# Patient Record
Sex: Male | Born: 1960 | Race: White | Hispanic: Yes | Smoking: Never smoker
Health system: Southern US, Community
[De-identification: ages and names within clinical notes are randomized; demographics above are authoritative.]

---

## 2015-02-25 ENCOUNTER — Encounter: Payer: Self-pay | Admitting: Emergency Medicine

## 2015-02-25 ENCOUNTER — Ambulatory Visit: Payer: Worker's Compensation

## 2015-02-25 ENCOUNTER — Ambulatory Visit
Admission: EM | Admit: 2015-02-25 | Discharge: 2015-02-25 | Disposition: A | Discharge status: Home / Self Care | Payer: Worker's Compensation | Attending: Family Medicine | Admitting: Family Medicine

## 2015-02-25 DIAGNOSIS — M5432 Sciatica, left side: Secondary | ICD-10-CM

## 2015-02-25 DIAGNOSIS — S338XXA Sprain of other parts of lumbar spine and pelvis, initial encounter: Secondary | ICD-10-CM | POA: Diagnosis not present

## 2015-02-25 DIAGNOSIS — S39012A Strain of muscle, fascia and tendon of lower back, initial encounter: Secondary | ICD-10-CM

## 2015-02-25 MED ORDER — CYCLOBENZAPRINE HCL 10 MG PO TABS: 10.0000 mg | ORAL_TABLET | Freq: Three times a day (TID) | ORAL | Status: DC | PRN

## 2015-02-25 MED ORDER — PREDNISONE 10 MG PO TABS: ORAL_TABLET | ORAL | Status: DC

## 2015-02-25 MED ORDER — KETOROLAC TROMETHAMINE 60 MG/2ML IM SOLN
60.0000 mg | Freq: Once | INTRAMUSCULAR | Status: AC
  Administered 2015-02-25: 60 mg via INTRAMUSCULAR

## 2015-02-25 NOTE — ED Notes (Signed)
Patient c/o lower back pain since last week.  Patient [place]

## 2015-02-25 NOTE — ED Provider Notes (Signed)
[facility] Emergency Department Provider Note  ____________________________________________  Time seen: Approximately 10:00 AM  I have reviewed the triage vital signs and the nursing notes.   HISTORY  Chief Complaint Back Pain    HPI Andre Yang is a 54 y.o. malepresents for the complaint of left lower back pain. Patient reports [place] Patient states while he was there he was having to move a lot of sand bags. Patient states that as the day progressed he started to feel some low back pain. Patient states that that he was doing a lot of bending and twisting. Denies fall or direct trauma. States he had some soreness while he was moving the sand bags. States the soreness continued but the next day he woke up with increased pain. Patient states that he has had continued pain since. Reports over-the-counter ibuprofen did initially help with the pain. States if he is sitting still and not moving he is an 0 out of 10 pain. However reports with bending, twisting and walking he'll have intermittent sharp pain. Patient states that sharp pain at highest is 6 out of 10 and states that occasionally pain will radiate down left leg. Describes left leg radiation as a sharp tingling pain.States pain also present with walking. Denies numbness or loss of sensation.   Denies other back pain or neck pain. Denies urinary or bowel retention or incontinence. Denies numbness or tingling sensation. Patient again reports that when he is sitting still pain is 0/10. States over-the-counter ibuprofen and Aleve has helped some but reports over the last few days ibuprofen or Aleve has not helped.    History reviewed. No pertinent past medical history.  There are no active problems to display for this patient.   History reviewed. No pertinent past surgical history.  No current outpatient prescriptions on file.  Allergies Review of patient's  allergies indicates no known allergies.  History reviewed. No pertinent family history.  Social History Social History  Substance Use Topics  . Smoking status: Never Smoker   . Smokeless tobacco: Never Used  . Alcohol Use: Yes    Review of Systems Constitutional: No fever/chills Eyes: No visual changes. ENT: No sore throat. Cardiovascular: Denies chest pain. Respiratory: Denies shortness of breath. Gastrointestinal: No abdominal pain.  No nausea, no vomiting.  No diarrhea.  No constipation. Genitourinary: Negative for dysuria. Musculoskeletal: positive for back pain. Skin: Negative for rash. Neurological: Negative for headaches, focal weakness or numbness.  10-point ROS otherwise negative.  ____________________________________________   PHYSICAL EXAM:  VITAL SIGNS: ED Triage Vitals  Enc Vitals Group     BP 02/25/15 0919 142/90 mmHg     Pulse Rate 02/25/15 0919 90     Resp 02/25/15 0919 16     Temp 02/25/15 0919 97.5 F (36.4 C)     Temp Source 02/25/15 0919 Tympanic     SpO2 02/25/15 0919 98 %     Weight 02/25/15 0919 250 lb (113.399 kg)     Height 02/25/15 0919  (1.753 m)     Head Cir --      Peak Flow --      Pain Score 02/25/15 0921 8     Pain Loc --      Pain Edu? --      Excl. in GC? --    Today's Vitals   02/25/15 0919 02/25/15 0921 02/25/15 1103  BP: 142/90  133/91  Pulse: 90  91  Temp: 97.5 F (36.4 C)    TempSrc: Tympanic    Resp: 16    Height: 5\' 9"  (1.753 m)    Weight: 250 lb (113.399 kg)    SpO2: 98%    PainSc:  8  5     Constitutional: Alert and oriented. Well appearing and in no acute distress. Eyes: Conjunctivae are normal. PERRL. EOMI. Head: Atraumatic.  Nose: No congestion/rhinnorhea.  Mouth/Throat: Mucous membranes are moist.  Oropharynx non-erythematous. Neck: No stridor.  No cervical spine tenderness to palpation. Hematological/Lymphatic/Immunilogical: No cervical lymphadenopathy. Cardiovascular: Normal rate, regular  rhythm. Grossly normal heart sounds.  Good peripheral circulation. Respiratory: Normal respiratory effort.  No retractions. Lungs CTAB. Gastrointestinal: Soft and nontender. No distention. Normal Bowel sounds. No CVA tenderness. Musculoskeletal: No lower or upper extremity tenderness nor edema.  No joint effusions. Bilateral pedal pulses equal and easily palpated.  No cervical or thoracic tenderness to palpation. No CVA tenderness. Mild mid lower lumbar tenderness to palpation, mild to moderate left paralumbar to palpation and tenderness over left sciatic notch. Patient changes position from lying to standing quickly without distress. Right straight leg test negative. Mild pain with left straight leg test at approximately 45 degrees. No saddle anesthesia. Good strength with plantar flexion and dorsiflexion Bilaterally. No ataxia. Mild antalgic gait. No ecchymosis or swelling.  Neurologic:  Normal speech and language. No gross focal neurologic deficits are appreciated. No gait instability. Skin:  Skin is warm, dry and intact. No rash noted. Psychiatric: Mood and affect are normal. Speech and behavior are normal.   ____________________________________________   LABS (all labs ordered are listed, but only abnormal results are displayed)  Labs Reviewed - No data to display  RADIOLOGY   EXAM: LUMBAR SPINE - COMPLETE 4+ VIEW  COMPARISON: None.  FINDINGS: Normal alignment of the lumbar vertebral bodies. The disc spaces are maintained. Mild degenerative changes with marginal osteophytes. Moderate facet disease in the lower lumbar spine without definite pars defects. The visualized bony pelvis is intact. Mild SI joint degenerative changes.  IMPRESSION: Degenerative changes but no acute findings.   Electronically Signed By: Rudie MeyerP. Gallerani M.D. On: 02/25/2015 10:38  I, Renford DillsLindsey Taavi Hoose, personally viewed and evaluated these images (plain radiographs) as part of my medical decision  making.     INITIAL IMPRESSION / ASSESSMENT AND PLAN / ED COURSE  Pertinent labs & imaging results that were available during my care of the patient were reviewed by me and considered in my medical decision making (see chart for details).  Very well-appearing patient. No acute distress. Presents for complaints of left lower back pain. Reports onset was while [place] multiple heavy [place] 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 Patient reports that he did have pain during activity but pain increased the next day after lying still in bed and then waking up. Patient does report the pain and the pain radiation down left leg primarily associated with movement. Denies other pain.  Denies urinary or bowel changes or retention. Denies direct trauma or fall. IM Toradol 60 mg.    Ambulatory with steady gait. Changes position from lying to standing quickly without distress. Lumbar x-ray with degenerative changes but no acute findings. Patient reports that after IM Toradol pain is much improved and sharp pain that he was receiving with position changes has decreased. Discussed supportive treatments. Lumbar xray degenerative changes but no acute per radiology. 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 Work note given for today.   Discussed with patient that if pain continues or worsens he'll need to follow-up with orthopedic or physician of his employers choice for follow up. As over the counter ibuprofen no longer helping patient's pain will treat with prednisone taper and prn flexeril. Discussed stretching, ice and rest. Discussed follow up with Primary care physician this week.  Discussed follow up and return parameters including no resolution or any worsening concerns. Patient verbalized understanding and agreed to plan.   ____________________________________________   FINAL CLINICAL IMPRESSION(S) / ED DIAGNOSES  Final diagnoses:  Lumbosacral strain, initial encounter  Sciatica of left side       Renford Dills, NP 02/25/15 1526

## 2015-02-25 NOTE — Discharge Instructions (Signed)
Take medication as prescribed. Apply ice. Rest. Stretch as discussed.   Follow up with your primary care physician as needed. Follow up with orthopedic or workers compensation physician or your employees choice as needed for continued pain.   Return to Urgent care for new or worsening concerns.   Back Exercises The following exercises strengthen the muscles that help to support the back. They also help to keep the lower back flexible. Doing these exercises can help to prevent back pain or lessen existing pain. If you have back pain or discomfort, try doing these exercises 2-3 times each day or as told by your health care provider. When the pain goes away, do them once each day, but increase the number of times that you repeat the steps for each exercise (do more repetitions). If you do not have back pain or discomfort, do these exercises once each day or as told by your health care provider. EXERCISES Single Knee to Chest Repeat these steps 3-5 times for each leg:  Lie on your back on a firm bed or the floor with your legs extended.  Bring one knee to your chest. Your other leg should stay extended and in contact with the floor.  Hold your knee in place by grabbing your knee or thigh.  Pull on your knee until you feel a gentle stretch in your lower back.  Hold the stretch for 10-30 seconds.  Slowly release and straighten your leg. Pelvic Tilt Repeat these steps 5-10 times:  Lie on your back on a firm bed or the floor with your legs extended.  Bend your knees so they are pointing toward the ceiling and your feet are flat on the floor.  Tighten your lower abdominal muscles to press your lower back against the floor. This motion will tilt your pelvis so your tailbone points up toward the ceiling instead of pointing to your feet or the floor.  With gentle tension and even breathing, hold this position for 5-10 seconds. Cat-Cow Repeat these steps until your lower back becomes more  flexible:  Get into a hands-and-knees position on a firm surface. Keep your hands under your shoulders, and keep your knees under your hips. You may place padding under your knees for comfort.  Let your head hang down, and point your tailbone toward the floor so your lower back becomes rounded like the back of a cat.  Hold this position for 5 seconds.  Slowly lift your head and point your tailbone up toward the ceiling so your back forms a sagging arch like the back of a cow.  Hold this position for 5 seconds. Press-Ups Repeat these steps 5-10 times:  Lie on your abdomen (face-down) on the floor.  Place your palms near your head, about shoulder-width apart.  While you keep your back as relaxed as possible and keep your hips on the floor, slowly straighten your arms to raise the top half of your body and lift your shoulders. Do not use your back muscles to raise your upper torso. You may adjust the placement of your hands to make yourself more comfortable.  Hold this position for 5 seconds while you keep your back relaxed.  Slowly return to lying flat on the floor. Bridges Repeat these steps 10 times:  Lie on your back on a firm surface.  Bend your knees so they are pointing toward the ceiling and your feet are flat on the floor.  Tighten your buttocks muscles and lift your buttocks off of the  floor until your waist is at almost the same height as your knees. You should feel the muscles working in your buttocks and the back of your thighs. If you do not feel these muscles, slide your feet 1-2 inches farther away from your buttocks.  Hold this position for 3-5 seconds.  Slowly lower your hips to the starting position, and allow your buttocks muscles to relax completely. If this exercise is too easy, try doing it with your arms crossed over your chest. Abdominal Crunches Repeat these steps 5-10 times:  Lie on your back on a firm bed or the floor with your legs extended.  Bend  your knees so they are pointing toward the ceiling and your feet are flat on the floor.  Cross your arms over your chest.  Tip your chin slightly toward your chest without bending your neck.  Tighten your abdominal muscles and slowly raise your trunk (torso) high enough to lift your shoulder blades a tiny bit off of the floor. Avoid raising your torso higher than that, because it can put too much stress on your low back and it does not help to strengthen your abdominal muscles.  Slowly return to your starting position. Back Lifts Repeat these steps 5-10 times:  Lie on your abdomen (face-down) with your arms at your sides, and rest your forehead on the floor.  Tighten the muscles in your legs and your buttocks.  Slowly lift your chest off of the floor while you keep your hips pressed to the floor. Keep the back of your head in line with the curve in your back. Your eyes should be looking at the floor.  Hold this position for 3-5 seconds.  Slowly return to your starting position. SEEK MEDICAL CARE IF:  Your back pain or discomfort gets much worse when you do an exercise.  Your back pain or discomfort does not lessen within 2 hours after you exercise. If you have any of these problems, stop doing these exercises right away. Do not do them again unless your health care provider says that you can. SEEK IMMEDIATE MEDICAL CARE IF:  You develop sudden, severe back pain. If this happens, stop doing the exercises right away. Do not do them again unless your health care provider says that you can.   This information is not intended to replace advice given to you by your health care provider. Make sure you discuss any questions you have with your health care provider.   Document Released: 06/04/2004 Document Revised: 01/16/2015 Document Reviewed: 06/21/2014 Elsevier Interactive Patient Education 2016 Else[employer] Inc.  Sciatica Sciatica is pain, weakness, numbness, or tingling along the path of  the sciatic nerve. The nerve starts in the lower back and runs down the back of each leg. The nerve controls the muscles in the lower leg and in the back of the knee, while also providing sensation to the back of the thigh, lower leg, and the sole of your foot. Sciatica is a symptom of another medical condition. For instance, nerve damage or certain conditions, such as a herniated disk or bone spur on the spine, pinch or put pressure on the sciatic nerve. This causes the pain, weakness, or other sensations normally associated with sciatica. Generally, sciatica only affects one side of the body. CAUSES   Herniated or slipped disc.  Degenerative disk disease.  A pain disorder involving the narrow muscle in the buttocks (piriformis syndrome).  Pelvic injury or fracture.  Pregnancy.  Tumor (rare). SYMPTOMS  Symptoms can  vary from mild to very severe. The symptoms usually travel from the low back to the buttocks and down the back of the leg. Symptoms can include:  Mild tingling or dull aches in the lower back, leg, or hip.  Numbness in the back of the calf or sole of the foot.  Burning sensations in the lower back, leg, or hip.  Sharp pains in the lower back, leg, or hip.  Leg weakness.  Severe back pain inhibiting movement. These symptoms may get worse with coughing, sneezing, laughing, or prolonged sitting or standing. Also, being overweight may worsen symptoms. DIAGNOSIS  Your caregiver will perform a physical exam to look for common symptoms of sciatica. He or she may ask you to do certain movements or activities that would trigger sciatic nerve pain. Other tests may be performed to find the cause of the sciatica. These may include:  Blood tests.  X-rays.  Imaging tests, such as an MRI or CT scan. TREATMENT  Treatment is directed at the cause of the sciatic pain. Sometimes, treatment is not necessary and the pain and discomfort goes away on its own. If treatment is needed, your  caregiver may suggest:  Over-the-counter medicines to relieve pain.  Prescription medicines, such as anti-inflammatory medicine, muscle relaxants, or narcotics.  Applying heat or ice to the painful area.  Steroid injections to lessen pain, irritation, and inflammation around the nerve.  Reducing activity during periods of pain.  Exercising and stretching to strengthen your abdomen and improve flexibility of your spine. Your caregiver may suggest losing weight if the extra weight makes the back pain worse.  Physical therapy.  Surgery to eliminate what is pressing or pinching the nerve, such as a bone spur or part of a herniated disk. HOME CARE INSTRUCTIONS   Only take over-the-counter or prescription medicines for pain or discomfort as directed by your caregiver.  Apply ice to the affected area for 20 minutes, 3-4 times a day for the first 48-72 hours. Then try heat in the same way.  Exercise, stretch, or perform your usual activities if these do not aggravate your pain.  Attend physical therapy sessions as directed by your caregiver.  Keep all follow-up appointments as directed by your caregiver.  Do not wear high heels or shoes that do not provide proper support.  Check your mattress to see if it is too soft. A firm mattress may lessen your pain and discomfort. SEEK IMMEDIATE MEDICAL CARE IF:   You lose control of your bowel or bladder (incontinence).  You have increasing weakness in the lower back, pelvis, buttocks, or legs.  You have redness or swelling of your back.  You have a burning sensation when you urinate.  You have pain that gets worse when you lie down or awakens you at night.  Your pain is worse than you have experienced in the past.  Your pain is lasting longer than 4 weeks.  You are suddenly losing weight without reason. MAKE SURE YOU:  Understand these instructions.  Will watch your condition.  Will get help right away if you are not doing well  or get worse.   This information is not intended to replace advice given to you by your health care provider. Make sure you discuss any questions you have with your health care provider.   Document Released: 04/21/2001 Document Revised: 01/16/2015 Document Reviewed: 09/06/2011 Elsevier Interactive Patient Education [employer]

## 2015-07-12 ENCOUNTER — Ambulatory Visit
Admission: EM | Admit: 2015-07-12 | Discharge: 2015-07-12 | Disposition: A | Discharge status: Home / Self Care | Payer: BLUE CROSS/BLUE SHIELD | Attending: Family Medicine | Admitting: Family Medicine

## 2015-07-12 DIAGNOSIS — B349 Viral infection, unspecified: Secondary | ICD-10-CM | POA: Diagnosis not present

## 2015-07-12 LAB — RAPID INFLUENZA A&B ANTIGENS: Influenza B (ARMC): NEGATIVE

## 2015-07-12 LAB — RAPID STREP SCREEN (MED CTR MEBANE ONLY): Streptococcus, Group A Screen (Direct): NEGATIVE

## 2015-07-12 LAB — RAPID INFLUENZA A&B ANTIGENS (ARMC ONLY): INFLUENZA A (ARMC): NEGATIVE

## 2015-07-12 MED ORDER — OSELTAMIVIR PHOSPHATE 75 MG PO CAPS: 75.0000 mg | ORAL_CAPSULE | Freq: Two times a day (BID) | ORAL | Status: DC

## 2015-07-12 MED ORDER — ALBUTEROL SULFATE HFA 108 (90 BASE) MCG/ACT IN AERS: 1.0000 | INHALATION_SPRAY | Freq: Four times a day (QID) | RESPIRATORY_TRACT | Status: DC | PRN

## 2015-07-12 MED ORDER — HYDROCOD POLST-CPM POLST ER 10-8 MG/5ML PO SUER: 5.0000 mL | Freq: Two times a day (BID) | ORAL | Status: DC | PRN

## 2015-07-12 NOTE — ED Notes (Addendum)
Patient c/o cough, chest pain from coughing, sore throat, fever (101 degrees yesterday), and body aches which all started this past Monday.

## 2015-07-12 NOTE — ED Provider Notes (Signed)
CSN: [id number]     Arrival date & time 07/12/15  4098 History   None    Chief Complaint  Patient presents with  . Cough  . Sore Throat   (Consider location/radiation/quality/duration/timing/severity/associated sxs/prior Treatment) Patient is a 55 y.o. male presenting with cough, pharyngitis, and URI. The history is provided by the patient.  Cough Associated symptoms: fever, headaches, myalgias and wheezing   Sore Throat Associated symptoms include headaches.  URI Presenting symptoms: congestion, cough, fatigue and fever   Severity:  Moderate Onset quality:  Sudden Duration:  2 days Timing:  Constant Progression:  Worsening Chronicity:  New Relieved by:  Nothing Ineffective treatments:  OTC medications Associated symptoms: arthralgias, headaches, myalgias and wheezing   Associated symptoms: no sinus pain   Risk factors: sick contacts   Risk factors: not elderly, no chronic cardiac disease, no chronic kidney disease, no chronic respiratory disease, no diabetes mellitus, no immunosuppression, no recent illness and no recent travel     History reviewed. No pertinent past medical history. History reviewed. No pertinent past surgical history. History reviewed. No pertinent family history. Social History  Substance Use Topics  . Smoking status: Never Smoker   . Smokeless tobacco: Never Used  . Alcohol Use: Yes    Review of Systems  Constitutional: Positive for fever and fatigue.  HENT: Positive for congestion.   Respiratory: Positive for cough and wheezing.   Musculoskeletal: Positive for myalgias and arthralgias.  Neurological: Positive for headaches.    Allergies  Review of patient's allergies indicates no known allergies.  Home Medications   Prior to Admission medications   Medication Sig Start Date End Date Taking? Authorizing Provider  albuterol (PROVENTIL HFA;VENTOLIN HFA) 108 (90 Base) MCG/ACT inhaler Inhale 1-2 puffs into the lungs every 6 (six) hours as needed  for wheezing or shortness of breath. 07/12/15   Payton Mccallum, MD  chlorpheniramine-HYDROcodone (TUSSIONEX PENNKINETIC ER) 10-8 MG/5ML SUER Take 5 mLs by mouth every 12 (twelve) hours as needed for cough. 07/12/15   Payton Mccallum, MD  cyclobenzaprine (FLEXERIL) 10 MG tablet Take 1 tablet (10 mg total) by mouth every 8 (eight) hours as needed for muscle spasms (PRN pain. Do not drive or operate heavy machinery while taking as can cause drowsiness.). 02/25/15   Renford Dills, NP  oseltamivir (TAMIFLU) 75 MG capsule Take 1 capsule (75 mg total) by mouth 2 (two) times daily. 07/12/15   Payton Mccallum, MD  predniSONE (DELTASONE) 10 MG tablet Start 60 mg po day one, then 50 mg po day two, taper by 10 mg daily until complete. 02/25/15   Renford Dills, NP   Meds Ordered and Administered this Visit  Medications - No data to display  BP 137/90 mmHg  Pulse 108  Temp(Src) 100.9 F (38.3 C) (Oral)  Resp 20  Ht  (1.753 m)  Wt 270 lb (122.471 kg)  BMI 39.85 kg/m2  SpO2 96% No data found.   Physical Exam  Constitutional: He appears well-developed and well-nourished. No distress.  HENT:  Head: Normocephalic and atraumatic.  Right Ear: Tympanic membrane, external ear and ear canal normal.  Left Ear: Tympanic membrane, external ear and ear canal normal.  Nose: Rhinorrhea present.  Mouth/Throat: Uvula is midline, oropharynx is clear and moist and mucous membranes are normal. No oropharyngeal exudate or tonsillar abscesses.  Eyes: Conjunctivae and EOM are normal. Pupils are equal, round, and reactive to light. Right eye exhibits no discharge. Left eye exhibits no discharge. No scleral icterus.  Neck: Normal range of  motion. Neck supple. No tracheal deviation present. No thyromegaly present.  Cardiovascular: Normal rate, regular rhythm and normal heart sounds.   Pulmonary/Chest: Effort normal and breath sounds normal. No stridor. No respiratory distress. He has no wheezes. He has no rales. He exhibits no  tenderness.  Lymphadenopathy:    He has no cervical adenopathy.  Neurological: He is alert.  Skin: Skin is warm and dry. No rash noted. He is not diaphoretic.  Nursing note and vitals reviewed.   ED Course  Procedures (including critical care time)  Labs Review Labs Reviewed  RAPID INFLUENZA A&B ANTIGENS ([facility] ONLY)  RAPID STREP SCREEN (NOT AT [facility])  CULTURE, GROUP A STREP [facility])    Imaging Review No results found.   Visual Acuity Review  Right Eye Distance:   Left Eye Distance:   Bilateral Distance:    Right Eye Near:   Left Eye Near:    Bilateral Near:         MDM   1. Viral syndrome    New Prescriptions   ALBUTEROL (PROVENTIL HFA;VENTOLIN HFA) 108 (90 BASE) MCG/ACT INHALER    Inhale 1-2 puffs into the lungs every 6 (six) hours as needed for wheezing or shortness of breath.   CHLORPHENIRAMINE-HYDROCODONE (TUSSIONEX PENNKINETIC ER) 10-8 MG/5ML SUER    Take 5 mLs by mouth every 12 (twelve) hours as needed for cough.   OSELTAMIVIR (TAMIFLU) 75 MG CAPSULE    Take 1 capsule (75 mg total) by mouth 2 (two) times daily.  1.  diagnosis reviewed with patient 2. rx as per orders above; reviewed possible side effects, interactions, risks and benefits  3. Recommend supportive treatment with rest, increased fluids, otc analgesics 4. Follow-up prn if symptoms worsen or don't improve    Payton Mccallumrlando Deshanda Molitor, MD 07/12/15 [phone]

## 2015-07-14 ENCOUNTER — Telehealth (HOSPITAL_COMMUNITY): Payer: Self-pay | Admitting: Internal Medicine

## 2015-07-14 DIAGNOSIS — J020 Streptococcal pharyngitis: Secondary | ICD-10-CM

## 2015-07-14 LAB — CULTURE, GROUP A STREP (THRC)

## 2015-07-14 MED ORDER — PENICILLIN V POTASSIUM 500 MG PO TABS: 500.0000 mg | ORAL_TABLET | Freq: Two times a day (BID) | ORAL | Status: DC

## 2015-07-14 NOTE — ED Notes (Signed)
Throat culture growing group B strep; rx penicillin V sent to pharmacy of record ([facility] in [place]). Clinical staff, please let patient know.  Recheck for persistent symptoms.  LM  Eustace MooreLaura W Sheran Newstrom, MD 07/14/15 [phone]

## 2015-07-15 ENCOUNTER — Telehealth: Payer: Self-pay

## 2015-07-15 NOTE — ED Notes (Signed)
Patient called [facility] just now stating no prescription had been sent to [facility] in [place]. This nurse contacted [facility] [place] and Rx for Veetids. Take 1 po bid x 10 days. #20. No refills. This is the Rx that Dr. Pollie FriarL. Murray had entered into the patient records. Patient informed and will pick up

## 2016-05-11 HISTORY — PX: EYE SURGERY: SHX253

## 2016-05-15 ENCOUNTER — Encounter: Payer: Self-pay | Admitting: Emergency Medicine

## 2016-05-15 ENCOUNTER — Ambulatory Visit
Admission: EM | Admit: 2016-05-15 | Discharge: 2016-05-15 | Disposition: A | Discharge status: Home / Self Care | Payer: BLUE CROSS/BLUE SHIELD | Attending: Family Medicine | Admitting: Family Medicine

## 2016-05-15 DIAGNOSIS — H1031 Unspecified acute conjunctivitis, right eye: Secondary | ICD-10-CM

## 2016-05-15 MED ORDER — POLYMYXIN B-TRIMETHOPRIM 10000-0.1 UNIT/ML-% OP SOLN: 2.0000 [drp] | OPHTHALMIC | 0 refills | Status: AC

## 2016-05-15 NOTE — Discharge Instructions (Signed)
Use medication as prescribed. Rest. Drink plenty of fluids. Good hand hygiene.  Follow up with ophthalmology as discussed.   Follow up with your primary care physician this week as needed. Return to Urgent care for new or worsening concerns.

## 2016-05-15 NOTE — ED Triage Notes (Signed)
Patient c/o redness and drainage form his right eye for a month.

## 2016-05-15 NOTE — ED Provider Notes (Signed)
[facility] ____________________________________________  Time seen: Approximately 12:51 PM  I have reviewed the triage vital signs and the nursing notes.   HISTORY  Chief Complaint Eye Problem   HPI Andre JanusJerry Yang is a 56 y.o. male presents for evaluation of right eye redness and yellow drainage. Patient reports he has been having some intermittent watery eye and drainage for the last 3-4 weeks, in which she felt was consistent with his normal hayfever. Patient reports over the last week he has had more frequent yellowish drainage from right eye and states that the last 2 days has had consistent yellowish drainage in the from the right eye. Reports right eye redness. Reports some intermittent right eye irritation, but denies pain. Denies any foreign body sensation. Denies any abrupt onset. Denies any trauma, injury or foreign body. Denies pinkeye exposure. Denies chemical exposure. Denies welding. Reports intermittently has a mild blurriness to right eye with drainage present, denies other vision changes.  Denies any other complaints symptoms. Denies eye pain. Denies cough, congestion, fever, chest pain, shortness breath or other complaints.  History reviewed. No pertinent past medical history.  There are no active problems to display for this patient.   History reviewed. No pertinent surgical history.    No current facility-administered medications for this encounter.   Current Outpatient Prescriptions:  .  trimethoprim-polymyxin b (POLYTRIM) ophthalmic solution, Place 2 drops into the right eye every 4 (four) hours. For 7 days, Disp: 10 mL, Rfl: 0  Allergies Patient has no known allergies.  History reviewed. No pertinent family history.  Social History Social History  Substance Use Topics  . Smoking status: Never Smoker  . Smokeless tobacco: Never Used  . Alcohol use Yes    Review of Systems Constitutional: No fever/chills Eyes: .As above. ENT: No sore  throat. Cardiovascular: Denies chest pain. Respiratory: Denies shortness of breath. Gastrointestinal: No abdominal pain.  No nausea, no vomiting.  No diarrhea.  No constipation. Genitourinary: Negative for dysuria. Musculoskeletal: Negative for back pain. Skin: Negative for rash. Neurological: Negative for headaches, focal weakness or numbness.  10-point ROS otherwise negative.  ____________________________________________   PHYSICAL EXAM:  VITAL SIGNS: ED Triage Vitals  Enc Vitals Group     BP 05/15/16 1131 (!) 147/90     Pulse Rate 05/15/16 1131 96     Resp 05/15/16 1131 18     Temp 05/15/16 1131 98.5 F (36.9 C)     Temp Source 05/15/16 1131 Oral     SpO2 05/15/16 1131 96 %     Weight 05/15/16 1128 240 lb (108.9 kg)     Height 05/15/16 1128 5\' 9"  (1.753 m)     Head Circumference --      Peak Flow --      Pain Score 05/15/16 1130 6     Pain Loc --      Pain Edu? --      Excl. in GC? --      Visual Acuity  Right Eye Distance: 20/100 uncorrected rechecked 20/50 Left Eye Distance: 20/50 uncorrected rechecked 20/40 Bilateral Distance:    Constitutional: Alert and oriented. Well appearing and in no acute distress. Eyes:  Minimal left conjunctival injection. Mild right conjunctival injection. Active yellowish drainage present to right eye with surrounding yellowish crusting. No left eye drainage. No foreign bodies visualized bilaterally.PERRL. EOMI. No pain with EOMs. Bilateral eyes nontender. No surrounding tenderness, swelling or erythema bilaterally. ENT      Head: Normocephalic and atraumatic.  Nose: No congestion/rhinnorhea.      Mouth/Throat: Mucous membranes are moist.Oropharynx non-erythematous. No tonsillar swelling or exudate.  Neck: No stridor. Supple without meningismus.  Hematological/Lymphatic/Immunilogical: No cervical lymphadenopathy. Cardiovascular: Normal rate, regular rhythm. Grossly normal heart sounds.  Good peripheral circulation. Respiratory:  Normal respiratory effort without tachypnea nor retractions. Breath sounds are clear and equal bilaterally. No wheezes/rales/rhonchi.. Gastrointestinal: Soft and nontender. No distention. Normal Bowel sounds. No CVA tenderness. Musculoskeletal:  Ambulatory with steady gait. Neurologic:  Normal speech and language. Speech is normal. No gait instability.  Skin:  Skin is warm, dry and intact. No rash noted. Psychiatric: Mood and affect are normal. Speech and behavior are normal. Patient exhibits appropriate insight and judgment   ___________________________________________   LABS (all labs ordered are listed, but only abnormal results are displayed)  Labs Reviewed - No data to display  PROCEDURES Procedures    INITIAL IMPRESSION / ASSESSMENT AND PLAN / ED COURSE  Pertinent labs & imaging results that were available during my care of the patient were reviewed by me and considered in my medical decision making (see chart for details).  Well-appearing patient. No acute distress. Suspect patient had initial viral or allergic conjunctivitis progressing to bacterial conjunctivitis. Denies foreign body sensation or trauma. Will treat patient with Polytrim ophthalmic drops to right eye. Encourage ophthalmology follow-up. Discussed supportive care and good hand hygiene. Discussed strict follow-up and return parameters.Discussed indication, risks and benefits of medications with patient.  Discussed follow up with Primary care physician this week. Discussed follow up and return parameters including no resolution or any worsening concerns. Patient verbalized understanding and agreed to plan.   ____________________________________________   FINAL CLINICAL IMPRESSION(S) / ED DIAGNOSES  Final diagnoses:  Acute bacterial conjunctivitis of right eye     Discharge Medication List as of 05/15/2016 12:59 PM    START taking these medications   Details  trimethoprim-polymyxin b (POLYTRIM) ophthalmic  solution Place 2 drops into the right eye every 4 (four) hours. For 7 days, Starting Fri 05/15/2016, Until Fri 05/22/2016, Normal        Note: This dictation was prepared with Dragon dictation along with smaller phrase technology. Any transcriptional errors that result from this process are unintentional.    Clinical Course       Renford Dills, NP 05/15/16 1311

## 2016-08-14 ENCOUNTER — Other Ambulatory Visit
Admission: RE | Admit: 2016-08-14 | Discharge: 2016-08-14 | Disposition: A | Discharge status: Home / Self Care | Payer: BLUE CROSS/BLUE SHIELD | Source: Ambulatory Visit | Attending: Ophthalmology | Admitting: Ophthalmology

## 2016-08-14 ENCOUNTER — Encounter: Payer: Self-pay | Admitting: Emergency Medicine

## 2016-08-14 ENCOUNTER — Emergency Department
Admission: EM | Admit: 2016-08-14 | Discharge: 2016-08-14 | Disposition: A | Discharge status: Home / Self Care | Payer: BLUE CROSS/BLUE SHIELD | Attending: Emergency Medicine | Admitting: Emergency Medicine

## 2016-08-14 DIAGNOSIS — H1089 Other conjunctivitis: Secondary | ICD-10-CM | POA: Insufficient documentation

## 2016-08-14 DIAGNOSIS — H109 Unspecified conjunctivitis: Secondary | ICD-10-CM

## 2016-08-14 DIAGNOSIS — H1031 Unspecified acute conjunctivitis, right eye: Secondary | ICD-10-CM

## 2016-08-14 DIAGNOSIS — H578 Other specified disorders of eye and adnexa: Secondary | ICD-10-CM | POA: Diagnosis present

## 2016-08-14 MED ORDER — MOXIFLOXACIN HCL 0.5 % OP SOLN: 1.0000 [drp] | Freq: Three times a day (TID) | OPHTHALMIC | 0 refills | Status: DC

## 2016-08-14 MED ORDER — MOXIFLOXACIN HCL 0.5 % OP SOLN
1.0000 [drp] | Freq: Once | OPHTHALMIC | Status: AC
  Administered 2016-08-14: 1 [drp] via OPHTHALMIC
  Filled 2016-08-14: qty 3

## 2016-08-14 MED ORDER — FLUORESCEIN SODIUM 0.6 MG OP STRP
2.0000 | ORAL_STRIP | Freq: Once | OPHTHALMIC | Status: AC
  Administered 2016-08-14: 2 via OPHTHALMIC
  Filled 2016-08-14: qty 2

## 2016-08-14 MED ORDER — TETRACAINE HCL 0.5 % OP SOLN
2.0000 [drp] | Freq: Once | OPHTHALMIC | Status: AC
  Administered 2016-08-14: 2 [drp] via OPHTHALMIC
  Filled 2016-08-14: qty 2

## 2016-08-14 NOTE — ED Triage Notes (Addendum)
Patient ambulatory to triage with steady gait, without difficulty or distress noted; pt reports seen at urgent care month ago and rx drops for right eye ?infection, never improved; this morning having difficulty seeing out of such; swelling noted to lower sclera with redness to conjunctivae

## 2016-08-14 NOTE — ED Provider Notes (Signed)
[facility] Emergency Department Provider Note  ____________________________________________   First MD Initiated Contact with Patient 08/14/16 [phone]     (approximate)  I have reviewed the triage vital signs and the nursing notes.   HISTORY  Chief Complaint Eye Problem    HPI Andre Yang is a 56 y.o. male who presents to the emergency department with 1 day of swelling to his upper and lower eyelids. For the last 2 weeks or so he has woken up every morning with thick crusting discharge and he can't open his right eye until he gets in the shower and washes. About a month or 2 ago he had a similar episode that was not so severe and he went to an urgent care and was given an unknown eyedrop which initially helped but the symptoms recurred 2 weeks ago. He does not wear glasses and does not work contact lenses.   History reviewed. No pertinent past medical history.  There are no active problems to display for this patient.   History reviewed. No pertinent surgical history.  Prior to Admission medications   Medication Sig Start Date End Date Taking? Authorizing Provider  moxifloxacin (VIGAMOX) 0.5 % ophthalmic solution Place 1 drop into the right eye 3 (three) times daily. 08/14/16   Merrily Brittle, MD    Allergies Patient has no known allergies.  No family history on file.  Social History Social History  Substance Use Topics  . Smoking status: Never Smoker  . Smokeless tobacco: Never Used  . Alcohol use Yes    Review of Systems Constitutional: No fever/chills Eyes: Positive visual changes. ENT: No sore throat. Cardiovascular: Denies chest pain. Respiratory: Denies shortness of breath. Gastrointestinal: No abdominal pain.  No nausea, no vomiting.  No diarrhea.  No constipation. Genitourinary: Negative for dysuria. Musculoskeletal: Negative for back pain. Skin: Negative for rash. Neurological: Negative for headaches, focal weakness or  numbness.  10-point ROS otherwise negative.  ____________________________________________   PHYSICAL EXAM:  VITAL SIGNS: ED Triage Vitals  Enc Vitals Group     BP 08/14/16 0602 (!) 147/82     Pulse Rate 08/14/16 0602 97     Resp 08/14/16 0602 18     Temp 08/14/16 0602 98 F (36.7 C)     Temp Source 08/14/16 0602 Oral     SpO2 08/14/16 0602 95 %     Weight 08/14/16 0600 270 lb (122.5 kg)     Height 08/14/16 0600  (1.753 m)     Head Circumference --      Peak Flow --      Pain Score 08/14/16 0600 1     Pain Loc --      Pain Edu? --      Excl. in GC? --     Constitutional: Alert and oriented x 4 well appearing nontoxic no diaphoresis speaks in full, clear sentences Eyes: PERRL EOMI.    Visual Acuity  Right Eye Distance: 20/40 Left Eye Distance: 20/25 Bilateral Distance:    Right Eye Near:   Left Eye Near:    Bilateral Near:     IOP 18 OD IOP 16 OS  No fluorescein uptake noted Significant crusting discharge on upper and lower lids Scleral chemosis on the right  Head: Atraumatic. Nose: No congestion/rhinnorhea. Mouth/Throat: No trismus Neck: No stridor.   Cardiovascular: Normal rate, regular rhythm. Grossly normal heart sounds.  Good peripheral circulation. Respiratory: Normal respiratory effort.  No retractions. Lungs CTAB and moving good air Musculoskeletal: No  lower extremity edema   Neurologic:  Normal speech and language. No gross focal neurologic deficits are appreciated. Skin:  Skin is warm, dry and intact. No rash noted. Psychiatric: Mood and affect are normal. Speech and behavior are normal.    ____________________________________________  ____________________________________________   LABS (all labs ordered are listed, but only abnormal results are displayed)  Labs Reviewed - No data to  display   __________________________________________  EKG   ____________________________________________  RADIOLOGY   ____________________________________________   PROCEDURES  Procedure(s) performed: no  Procedures  Critical Care performed: no  ____________________________________________   INITIAL IMPRESSION / ASSESSMENT AND PLAN / ED COURSE  Pertinent labs & imaging results that were available during my care of the patient were reviewed by me and considered in my medical decision making (see chart for details).  The patient arrived with crusting discharge in his right eye along with chemosis and conjunctival injection. Fortunately his visual acuity was essentially normal in both eyes and his intraocular pressures were normal as well. His exam was most consistent with bacterial conjunctivitis and I performed a fluorescein exam under slit lamp and no ulcerations were noted. As he is alert he completed a course of Polytrim I will increase him to moxifloxacin drops and refer him to ophthalmology. He is discharged home in improved condition.      ____________________________________________   FINAL CLINICAL IMPRESSION(S) / ED DIAGNOSES  Final diagnoses:  Acute bacterial conjunctivitis of right eye      NEW MEDICATIONS STARTED DURING THIS VISIT:  Discharge Medication List as of 08/14/2016  7:42 AM    START taking these medications   Details  moxifloxacin (VIGAMOX) 0.5 % ophthalmic solution Place 1 drop into the right eye 3 (three) times daily., Starting Fri 08/14/2016, Print         Note:  This document was prepared using Dragon voice recognition software and may include unintentional dictation errors.     Merrily Brittle, MD 08/15/16 [phone]

## 2016-08-14 NOTE — Discharge Instructions (Signed)
Please call the eye center after you leave here this morning to establish follow-up appointment as soon as possible. Take all of your prescription eyedrops as prescribed. Return to the emergency department for any new or worsening symptoms such as worsening pain.  Today your visual acuity was 20/40 in your right eye and 20/25 in your left Your intraocular pressure was 18 on the right and 16 on the left Fortunately, I did not see any ulcers in your eye  It was a pleasure to take care of you today, and thank you for coming to our emergency department.  If you have any questions or concerns before leaving please ask the nurse to grab me and I'm more than happy to go through your aftercare instructions again.  If you were prescribed any opioid pain medication today such as Norco, Vicodin, Percocet, morphine, hydrocodone, or oxycodone please make sure you do not drive when you are taking this medication as it can alter your ability to drive safely.  If you have any concerns once you are home that you are not improving or are in fact getting worse before you can make it to your follow-up appointment, please do not hesitate to call 911 and come back for further evaluation.  Merrily Brittle MD  Results for orders placed or performed during the hospital encounter of 07/12/15  Rapid Influenza A&B Antigens ([facility] only)  Result Value Ref Range   Influenza A ([facility]) NEGATIVE NEGATIVE   Influenza B ([facility]) NEGATIVE NEGATIVE  Rapid strep screen  Result Value Ref Range   Streptococcus, Group A Screen (Direct) NEGATIVE NEGATIVE  Culture, group A strep  Result Value Ref Range   Specimen Description THROAT    Special Requests NONE Reflexed from [id number]    Culture      GROUP B STREP(S.AGALACTIAE)ISOLATED There is no known Penicillin Resistant Beta Streptococcus in the [place] For patients that are Penicillin-allergic, Erythromycin is 85-94% susceptible, and Clindamycin is 80% susceptible.  Contact Microbiology within  7 days if sensitivity testing is  required.   Virtually 100% of S. agalactiae (Group B) strains are susceptible to Penicillin.  For Penicillin-allergic patients, Erythromycin (85-95% sensitive) and Clindamycin (80% sensitive) are drugs of choice. Contact microbiology lab to request sensitivities if  needed within 7 days.    Report Status 07/14/2015 FINAL

## 2016-08-17 LAB — AEROBIC CULTURE  (SUPERFICIAL SPECIMEN)

## 2016-08-17 LAB — AEROBIC CULTURE W GRAM STAIN (SUPERFICIAL SPECIMEN)

## 2016-11-03 ENCOUNTER — Ambulatory Visit: Payer: BLUE CROSS/BLUE SHIELD | Attending: Internal Medicine

## 2016-11-03 DIAGNOSIS — G4733 Obstructive sleep apnea (adult) (pediatric): Secondary | ICD-10-CM | POA: Insufficient documentation

## 2016-11-03 DIAGNOSIS — G4710 Hypersomnia, unspecified: Secondary | ICD-10-CM | POA: Insufficient documentation

## 2016-11-03 DIAGNOSIS — R0683 Snoring: Secondary | ICD-10-CM | POA: Diagnosis present

## 2017-02-10 IMAGING — CR DG LUMBAR SPINE COMPLETE 4+V
5 series · 5 of 5 positions shown · non-contrast
Comparison: None.

CLINICAL DATA: Lifting injury 1 week ago.

EXAM:
LUMBAR SPINE - COMPLETE 4+ VIEW

[l-spine ap]
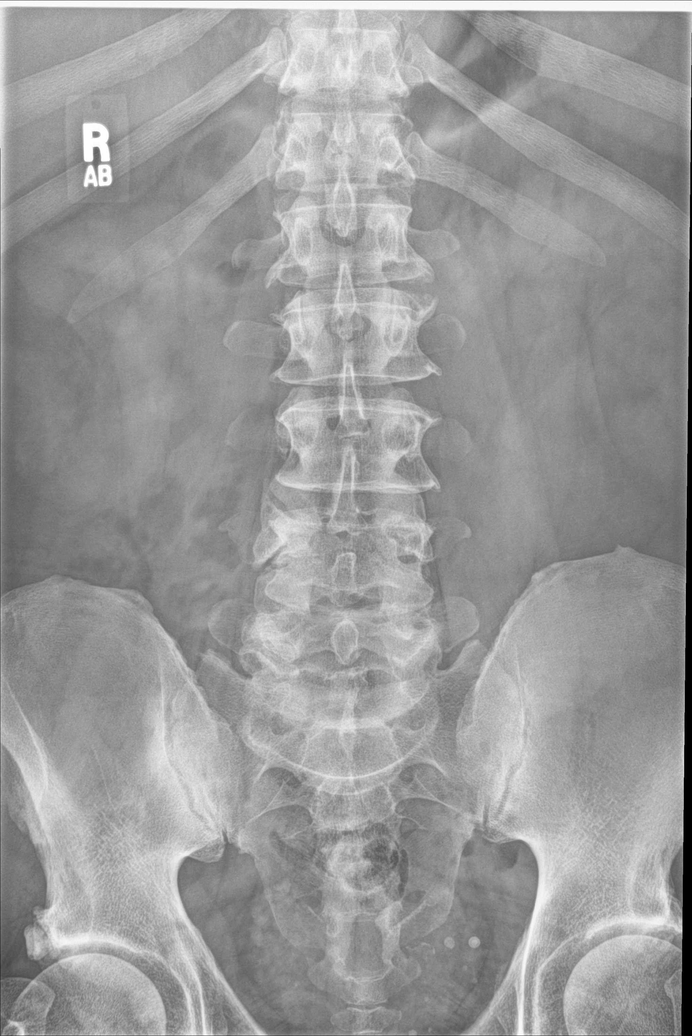

[l-spine obl (1 of 2)]
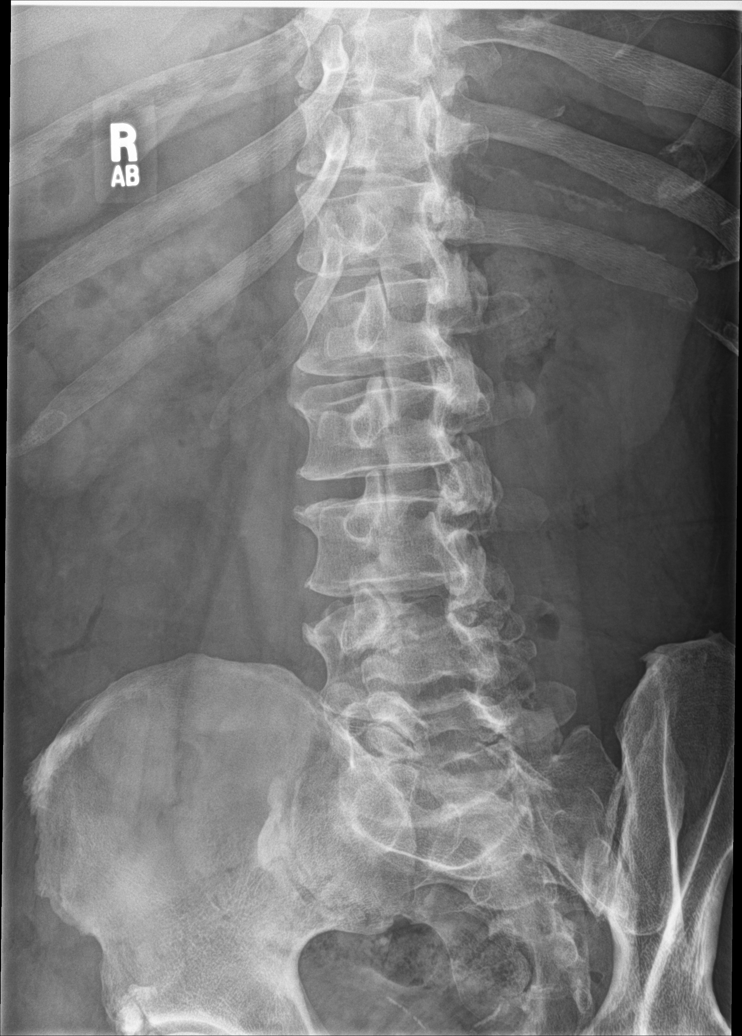

[l-spine obl (2 of 2)]
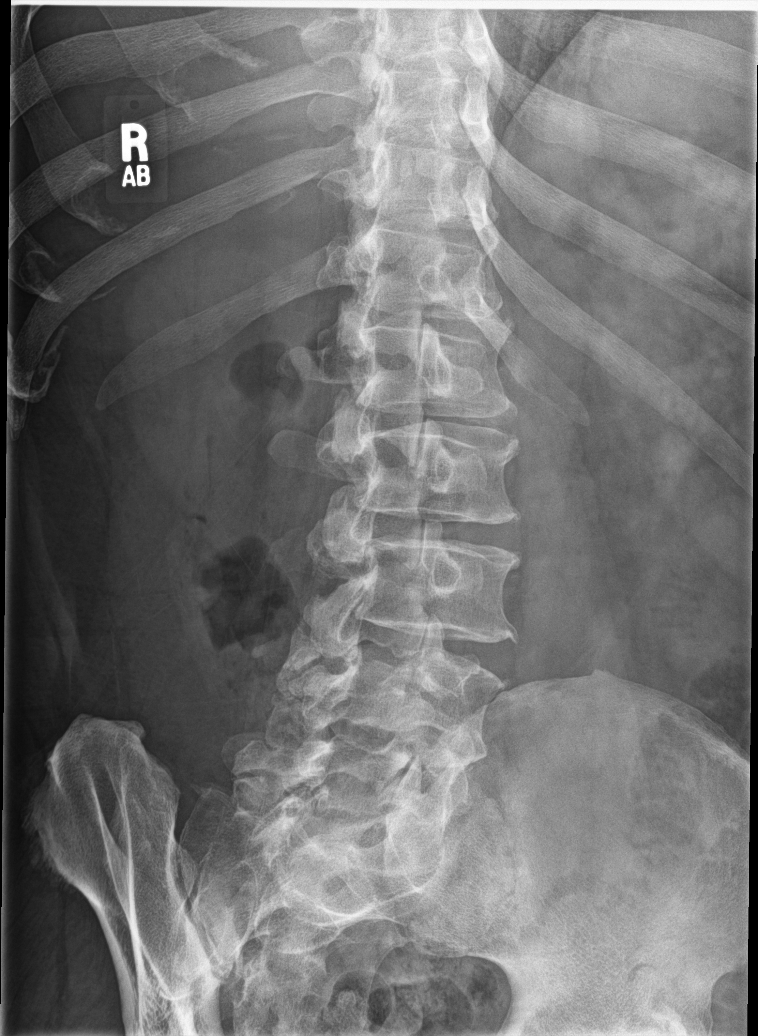

[l-spine lat (1 of 2)]
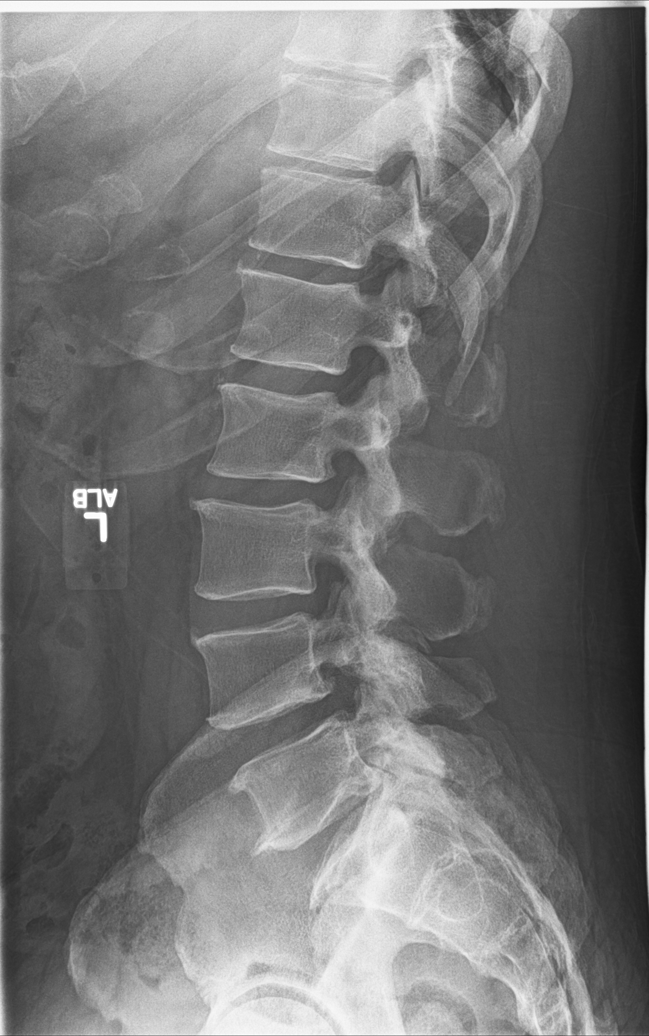

[l-spine lat (2 of 2)]
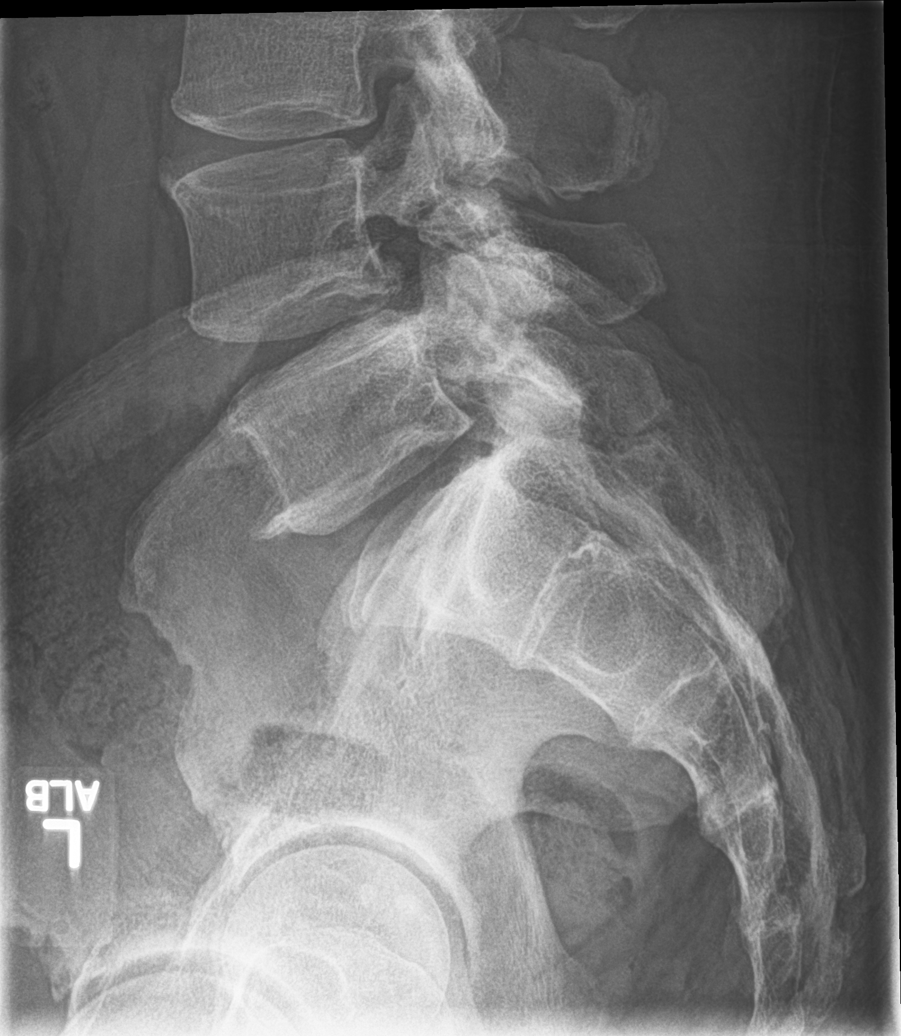

[5 of 5 positions shown; findings below may reference images not displayed]

FINDINGS: Normal alignment of the lumbar vertebral bodies. The disc spaces are
maintained. Mild degenerative changes with marginal osteophytes.
Moderate facet disease in the lower lumbar spine without definite
pars defects. The visualized bony pelvis is intact. Mild SI joint
degenerative changes.
IMPRESSION: Degenerative changes but no acute findings.

## 2018-02-21 ENCOUNTER — Other Ambulatory Visit: Payer: Self-pay

## 2018-02-21 ENCOUNTER — Ambulatory Visit
Admission: EM | Admit: 2018-02-21 | Discharge: 2018-02-21 | Disposition: A | Discharge status: Home / Self Care | Payer: BLUE CROSS/BLUE SHIELD | Attending: Family Medicine | Admitting: Family Medicine

## 2018-02-21 DIAGNOSIS — X500XXA Overexertion from strenuous movement or load, initial encounter: Secondary | ICD-10-CM | POA: Diagnosis not present

## 2018-02-21 DIAGNOSIS — S336XXA Sprain of sacroiliac joint, initial encounter: Secondary | ICD-10-CM | POA: Diagnosis not present

## 2018-02-21 DIAGNOSIS — M545 Low back pain: Secondary | ICD-10-CM | POA: Diagnosis not present

## 2018-02-21 MED ORDER — MELOXICAM 15 MG PO TABS: 15.0000 mg | ORAL_TABLET | Freq: Every day | ORAL | 0 refills | Status: DC

## 2018-02-21 NOTE — ED Provider Notes (Signed)
[facility]    CSN: [id number] Arrival date & time: 02/21/18  1023     History   Chief Complaint Chief Complaint  Patient presents with  . Back Pain    HPI Andre Yang is a 57 y.o. male.   HPI  57 year old male presents with nonradiating left lower back pain that started about 2 weeks ago.  He states he was moving heavy items in his garage when he first noticed it.  Sitting for any extended amount of time makes the pain worse.  Is been using Tylenol only with very little relief of pain.  Last week he had to make a trip to [place] for business entailing a 3 Hour drive  He states by the time he got home he had to practically "crawl out of the car.  Indicates the left sacroiliac joint as the area of the discomfort.  He denies any radicular symptoms.           History reviewed. No pertinent past medical history.  There are no active problems to display for this patient.   Past Surgical History:  Procedure Laterality Date  . EYE SURGERY Bilateral 2018       Home Medications    Prior to Admission medications   Medication Sig Start Date End Date Taking? Authorizing Provider  meloxicam (MOBIC) 15 MG tablet Take 1 tablet (15 mg total) by mouth daily. 02/21/18   Lutricia Feil, PA-C    Family History History reviewed. No pertinent family history.  Social History Social History   Tobacco Use  . Smoking status: Never Smoker  . Smokeless tobacco: Never Used  Substance Use Topics  . Alcohol use: Yes    Comment: occasional  . Drug use: Never     Allergies   Patient has no known allergies.   Review of Systems Review of Systems  Constitutional: Positive for activity change. Negative for appetite change, chills, fatigue and fever.  Musculoskeletal: Positive for back pain.  All other systems reviewed and are negative.    Physical Exam Triage Vital Signs ED Triage Vitals  Enc Vitals Group     BP 02/21/18 1039 (!) 147/98     Pulse Rate  02/21/18 1039 89     Resp 02/21/18 1039 18     Temp 02/21/18 1039 98.4 F (36.9 C)     Temp Source 02/21/18 1039 Oral     SpO2 02/21/18 1039 98 %     Weight 02/21/18 1037 265 lb (120.2 kg)     Height 02/21/18 1037 5\' 9"  (1.753 m)     Head Circumference --      Peak Flow --      Pain Score 02/21/18 1037 8     Pain Loc --      Pain Edu? --      Excl. in GC? --    No data found.  Updated Vital Signs BP (!) 147/98 (BP Location: Left Arm)   Pulse 89   Temp 98.4 F (36.9 C) (Oral)   Resp 18   Ht 5\' 9"  (1.753 m)   Wt 265 lb (120.2 kg)   SpO2 98%   BMI 39.13 kg/m   Visual Acuity Right Eye Distance:   Left Eye Distance:   Bilateral Distance:    Right Eye Near:   Left Eye Near:    Bilateral Near:     Physical Exam  Constitutional: He is oriented to person, place, and time. He appears well-developed and well-nourished. No  distress.  HENT:  Head: Normocephalic.  Eyes: Pupils are equal, round, and reactive to light. Right eye exhibits no discharge. Left eye exhibits no discharge.  Neck: Normal range of motion.  Musculoskeletal: Normal range of motion. He exhibits tenderness.  Lamination of the lumbar spine shows patient able to forward flex with his hands level of his ankles.  Returning to upright posture is slightly more difficult and complaining of pain over the left sacroiliac joint.  Tenderness is sharply localized over the sacroiliac joint which reproduces his symptoms.  Left lateral flexion also causes him to have discomfort and less so to the right.  Is able to toe and heel walk normally.  Neurological: He is alert and oriented to person, place, and time.  Skin: Skin is warm and dry. He is not diaphoretic.  Psychiatric: He has a normal mood and affect. His behavior is normal. Judgment and thought content normal.  Nursing note and vitals reviewed.    UC Treatments / Results  Labs (all labs ordered are listed, but only abnormal results are displayed) Labs Reviewed - No  data to display  EKG None  Radiology No results found.  Procedures Procedures (including critical care time)  Medications Ordered in UC Medications - No data to display  Initial Impression / Assessment and Plan / UC Course  I have reviewed the triage vital signs and the nursing notes.  Pertinent labs & imaging results that were available during my care of the patient were reviewed by me and considered in my medical decision making (see chart for details).   I have discussed with the patient sacroiliac joint function.  Told him he must avoid symptoms as much as possible.  He should limit his sitting lifting and bending.  He may continue taking the Tylenol but told him this has very little anti-inflammatory effect.  I instead we will place him on Mobic 15 mg daily.  He may use heat or ice alternating for comfort.  He is not improving he may return to our clinic or follow-up with his primary care physician.  Declined a Toradol injection today   Final Clinical Impressions(s) / UC Diagnoses   Final diagnoses:  Sacroiliac sprain, initial encounter     Discharge Instructions     Avoid symptoms.  Limit sitting lifting and bending.  Use heat or ice to the area for comfort   ED Prescriptions    Medication Sig Dispense Auth. Provider   meloxicam (MOBIC) 15 MG tablet Take 1 tablet (15 mg total) by mouth daily. 30 tablet Lutricia Feil, PA-C     Controlled Substance Prescriptions [place] Controlled Substance Registry consulted? Not Applicable   Lutricia Feil, PA-C 02/21/18 1652

## 2018-02-21 NOTE — ED Triage Notes (Signed)
Patient complains of left lower back pain that started x 2 weeks ago. Patient reports that he was moving some heavy things in garage. Patient reports that sitting for extended amount of time makes the pain worse. Patient reports that he has been using tylenol with little to no relief.

## 2018-02-21 NOTE — Discharge Instructions (Signed)
Avoid symptoms.  Limit sitting lifting and bending.  Use heat or ice to the area for comfort

## 2018-04-08 ENCOUNTER — Other Ambulatory Visit: Payer: Self-pay

## 2018-04-08 ENCOUNTER — Ambulatory Visit
Admission: EM | Admit: 2018-04-08 | Discharge: 2018-04-08 | Disposition: A | Discharge status: Home / Self Care | Payer: BLUE CROSS/BLUE SHIELD | Attending: Family Medicine | Admitting: Family Medicine

## 2018-04-08 ENCOUNTER — Encounter: Payer: Self-pay | Admitting: Emergency Medicine

## 2018-04-08 DIAGNOSIS — R05 Cough: Secondary | ICD-10-CM | POA: Diagnosis not present

## 2018-04-08 DIAGNOSIS — R059 Cough, unspecified: Secondary | ICD-10-CM

## 2018-04-08 MED ORDER — HYDROCOD POLST-CPM POLST ER 10-8 MG/5ML PO SUER: 5.0000 mL | Freq: Two times a day (BID) | ORAL | 0 refills | Status: DC | PRN

## 2018-04-08 NOTE — Discharge Instructions (Signed)
Cough medication as needed. ° °Take care ° °Dr. Maelys Kinnick  °

## 2018-04-08 NOTE — ED Provider Notes (Signed)
[facility]    CSN: [id number] Arrival date & time: 04/08/18  1404  History   Chief Complaint Chief Complaint  Patient presents with  . Cough   HPI  57 year old male presents with cough.  Patient reports a 3-day history of cough and congestion.  No fever.  No shortness of breath.  Worse at night.  No relieving factors.  He has taken Mucinex with no relief.  No other associated symptoms.  No other complaints.  Social Hx reviewed as below. Social History Social History   Tobacco Use  . Smoking status: Never Smoker  . Smokeless tobacco: Never Used  Substance Use Topics  . Alcohol use: Yes    Comment: occasional  . Drug use: Never     Allergies   Patient has no known allergies.   Review of Systems Review of Systems  Constitutional: Negative for fever.  Respiratory: Positive for cough.    Physical Exam Triage Vital Signs ED Triage Vitals  Enc Vitals Group     BP 04/08/18 1424 (!) 148/87     Pulse Rate 04/08/18 1424 91     Resp 04/08/18 1424 18     Temp 04/08/18 1424 98.2 F (36.8 C)     Temp Source 04/08/18 1424 Oral     SpO2 04/08/18 1424 99 %     Weight 04/08/18 1420 265 lb (120.2 kg)     Height 04/08/18 1420 5\' 9"  (1.753 m)     Head Circumference --      Peak Flow --      Pain Score 04/08/18 1420 0     Pain Loc --      Pain Edu? --      Excl. in GC? --    Updated Vital Signs BP (!) 148/87 (BP Location: Left Arm)   Pulse 91   Temp 98.2 F (36.8 C) (Oral)   Resp 18   Ht 5\' 9"  (1.753 m)   Wt 120.2 kg   SpO2 99%   BMI 39.13 kg/m   Visual Acuity Right Eye Distance:   Left Eye Distance:   Bilateral Distance:    Right Eye Near:   Left Eye Near:    Bilateral Near:     Physical Exam  Constitutional: He is oriented to person, place, and time. He appears well-developed. No distress.  HENT:  Head: Normocephalic and atraumatic.  Mouth/Throat: Oropharynx is clear and moist.  Cardiovascular: Normal rate and regular rhythm.    Pulmonary/Chest: Effort normal and breath sounds normal. He has no wheezes. He has no rales.  Neurological: He is alert and oriented to person, place, and time.  Psychiatric: He has a normal mood and affect. His behavior is normal.  Nursing note and vitals reviewed.  UC Treatments / Results  Labs (all labs ordered are listed, but only abnormal results are displayed) Labs Reviewed - No data to display  EKG None  Radiology No results found.  Procedures Procedures (including critical care time)  Medications Ordered in UC Medications - No data to display  Initial Impression / Assessment and Plan / UC Course  I have reviewed the triage vital signs and the nursing notes.  Pertinent labs & imaging results that were available during my care of the patient were reviewed by me and considered in my medical decision making (see chart for details).    57 year old male presents with cough.  Likely viral.  Tussionex as needed.  Final Clinical Impressions(s) / UC Diagnoses   Final diagnoses:  Cough     Discharge Instructions     Cough medication as needed.  Take care  Dr. Adriana Simas    ED Prescriptions    Medication Sig Dispense Auth. Provider   chlorpheniramine-HYDROcodone (TUSSIONEX PENNKINETIC ER) 10-8 MG/5ML SUER Take 5 mLs by mouth every 12 (twelve) hours as needed. 115 mL Tommie Sams, DO     Controlled Substance Prescriptions [place] Controlled Substance Registry consulted? Not Applicable   Tommie Sams, DO 04/08/18 1729

## 2018-04-08 NOTE — ED Triage Notes (Signed)
Pt c/o cough, chest congestion. Denies fever, or SOB.  Started about 3 days ago. He has been taking Mucinex with no relief.

## 2018-04-15 ENCOUNTER — Emergency Department
Admission: EM | Admit: 2018-04-15 | Discharge: 2018-04-15 | Disposition: A | Discharge status: Home / Self Care | Attending: Emergency Medicine | Admitting: Emergency Medicine

## 2018-04-15 DIAGNOSIS — Y9259 Other trade areas as the place of occurrence of the external cause: Secondary | ICD-10-CM | POA: Diagnosis not present

## 2018-04-15 DIAGNOSIS — M545 Low back pain: Secondary | ICD-10-CM | POA: Diagnosis present

## 2018-04-15 DIAGNOSIS — X500XXA Overexertion from strenuous movement or load, initial encounter: Secondary | ICD-10-CM | POA: Diagnosis not present

## 2018-04-15 DIAGNOSIS — Y9389 Activity, other specified: Secondary | ICD-10-CM | POA: Diagnosis not present

## 2018-04-15 DIAGNOSIS — M5416 Radiculopathy, lumbar region: Secondary | ICD-10-CM | POA: Insufficient documentation

## 2018-04-15 DIAGNOSIS — Z79899 Other long term (current) drug therapy: Secondary | ICD-10-CM | POA: Insufficient documentation

## 2018-04-15 DIAGNOSIS — Y990 Civilian activity done for income or pay: Secondary | ICD-10-CM | POA: Insufficient documentation

## 2018-04-15 DIAGNOSIS — M5410 Radiculopathy, site unspecified: Secondary | ICD-10-CM

## 2018-04-15 DIAGNOSIS — M62830 Muscle spasm of back: Secondary | ICD-10-CM

## 2018-04-15 MED ORDER — CYCLOBENZAPRINE HCL 10 MG PO TABS: 10.0000 mg | ORAL_TABLET | Freq: Three times a day (TID) | ORAL | 0 refills | Status: DC | PRN

## 2018-04-15 MED ORDER — TRAMADOL HCL 50 MG PO TABS: 50.0000 mg | ORAL_TABLET | Freq: Two times a day (BID) | ORAL | 0 refills | Status: DC | PRN

## 2018-04-15 NOTE — ED Triage Notes (Signed)
Pt presents for back pain that happened 3 wks ago at work. Pt states he was lifting a case of water when he hurt his lower back.

## 2018-04-15 NOTE — ED Notes (Signed)
FIRST NURSE NOTE:  Pt here POV for back pain that happened 3wks ago. Pt states it has progressively gotten worse. Pt is ambulatory and NAD

## 2018-04-15 NOTE — Discharge Instructions (Signed)
Take medication as directed.  Be advised medication may cause drowsiness.

## 2018-04-15 NOTE — ED Provider Notes (Signed)
[facility] Emergency Department Provider Note   ____________________________________________   First MD Initiated Contact with Patient 04/15/18 [phone]     (approximate)  I have reviewed the triage vital signs and the nursing notes.   HISTORY  Chief Complaint Back Pain    HPI Andre Yang is a 57 y.o. male patient complain low back pain for 3 weeks.  Onset of complaint secondary to lifting incident at work.  Patient stated last 4 days he has developed a radicular component to the left lower extremity.  Patient denies bladder bowel dysfunction.  Patient rates his pain discomfort a 5/10.  No palliative measure for complaint.  No past medical history on file.  There are no active problems to display for this patient.   Past Surgical History:  Procedure Laterality Date  . EYE SURGERY Bilateral 2018    Prior to Admission medications   Medication Sig Start Date End Date Taking? Authorizing Provider  chlorpheniramine-HYDROcodone (TUSSIONEX PENNKINETIC ER) 10-8 MG/5ML SUER Take 5 mLs by mouth every 12 (twelve) hours as needed. 04/08/18   Tommie Samsook, Jayce G, DO  cyclobenzaprine (FLEXERIL) 10 MG tablet Take 1 tablet (10 mg total) by mouth 3 (three) times daily as needed. 04/15/18   Joni ReiningSmith, Ronald K, PA-C  traMADol (ULTRAM) 50 MG tablet Take 1 tablet (50 mg total) by mouth every 12 (twelve) hours as needed. 04/15/18   Joni ReiningSmith, Ronald K, PA-C    Allergies Patient has no known allergies.  Family History  Problem Relation Age of Onset  . Healthy Mother   . Lung disease Father     Social History Social History   Tobacco Use  . Smoking status: Never Smoker  . Smokeless tobacco: Never Used  Substance Use Topics  . Alcohol use: Yes    Comment: occasional  . Drug use: Never    Review of Systems Constitutional: No fever/chills Eyes: No visual changes. ENT: No sore throat. Cardiovascular: Denies chest pain. Respiratory: Denies shortness of  breath. Gastrointestinal: No abdominal pain.  No nausea, no vomiting.  No diarrhea.  No constipation. Genitourinary: Negative for dysuria. Musculoskeletal: Positive for back pain. Skin: Negative for rash. Neurological: Negative for headaches, focal weakness or numbness.   ____________________________________________   PHYSICAL EXAM:  VITAL SIGNS: ED Triage Vitals  Enc Vitals Group     BP 04/15/18 0851 (!) 155/90     Pulse Rate 04/15/18 0851 (!) 103     Resp --      Temp 04/15/18 0851 97.7 F (36.5 C)     Temp Source 04/15/18 0851 Oral     SpO2 04/15/18 0850 99 %     Weight 04/15/18 0850 264 lb 15.9 oz (120.2 kg)     Height 04/15/18 0850 5\' 9"  (1.753 m)     Head Circumference --      Peak Flow --      Pain Score 04/15/18 0850 5     Pain Loc --      Pain Edu? --      Excl. in GC? --    Constitutional: Alert and oriented. Well appearing and in no acute distress. Cardiovascular: Normal rate, regular rhythm. Grossly normal heart sounds.  Good peripheral circulation.  Mildly elevated blood pressure. Respiratory: Normal respiratory effort.  No retractions. Lungs CTAB. Gastrointestinal: Soft and nontender. No distention. No abdominal bruits. No CVA tenderness. Musculoskeletal: No obvious spinal deformity.  No guarding palpation of the spinal processes.  Patient had left paraspinal muscle spasm.  Patient has negative  straight leg test. Neurologic:  Normal speech and language. No gross focal neurologic deficits are appreciated. No gait instability. Skin:  Skin is warm, dry and intact. No rash noted. Psychiatric: Mood and affect are normal. Speech and behavior are normal.  ____________________________________________   LABS (all labs ordered are listed, but only abnormal results are displayed)  Labs Reviewed - No data to display ____________________________________________  EKG   ____________________________________________  RADIOLOGY  ED MD interpretation:    Official  radiology report(s): No results found.  ____________________________________________   PROCEDURES  Procedure(s) performed: None  Procedures  Critical Care performed: No  ____________________________________________   INITIAL IMPRESSION / ASSESSMENT AND PLAN / ED COURSE  As part of my medical decision making, I reviewed the following data within the electronic MEDICAL RECORD NUMBER    Radicular low back pain with muscle spasms.  Patient given discharge care instruction work note.  Patient advised take medication as directed.  Patient advised combination of pain medication muscle relaxer may cause drowsiness.  Patient advised to follow-up with the [facility] community [facility] if condition persist.      ____________________________________________   FINAL CLINICAL IMPRESSION(S) / ED DIAGNOSES  Final diagnoses:  Radicular low back pain  Muscle spasm of back     ED Discharge Orders         Ordered    traMADol (ULTRAM) 50 MG tablet  Every 12 hours PRN     04/15/18 0931    cyclobenzaprine (FLEXERIL) 10 MG tablet  3 times daily PRN     04/15/18 0931           Note:  This document was prepared using Dragon voice recognition software and may include unintentional dictation errors.    Joni Reining, PA-C 04/15/18 1610    Sharman Cheek, MD 04/19/18 [phone]

## 2018-04-15 NOTE — ED Notes (Signed)
Unable to reach Andre Yang at [phone] for worker's comp instructions.

## 2018-04-29 ENCOUNTER — Emergency Department
Admission: EM | Admit: 2018-04-29 | Discharge: 2018-04-29 | Disposition: A | Discharge status: Home / Self Care | Attending: Emergency Medicine | Admitting: Emergency Medicine

## 2018-04-29 ENCOUNTER — Other Ambulatory Visit: Payer: Self-pay

## 2018-04-29 ENCOUNTER — Emergency Department

## 2018-04-29 ENCOUNTER — Encounter: Payer: Self-pay | Admitting: Emergency Medicine

## 2018-04-29 DIAGNOSIS — M5432 Sciatica, left side: Secondary | ICD-10-CM | POA: Insufficient documentation

## 2018-04-29 DIAGNOSIS — M4310 Spondylolisthesis, site unspecified: Secondary | ICD-10-CM | POA: Diagnosis not present

## 2018-04-29 DIAGNOSIS — M5416 Radiculopathy, lumbar region: Secondary | ICD-10-CM | POA: Insufficient documentation

## 2018-04-29 DIAGNOSIS — M545 Low back pain: Secondary | ICD-10-CM | POA: Diagnosis present

## 2018-04-29 MED ORDER — LIDOCAINE 5 % EX PTCH: 1.0000 | MEDICATED_PATCH | CUTANEOUS | 0 refills | Status: AC

## 2018-04-29 MED ORDER — OXYCODONE-ACETAMINOPHEN 5-325 MG PO TABS
1.0000 | ORAL_TABLET | Freq: Once | ORAL | Status: AC
  Administered 2018-04-29: 1 via ORAL
  Filled 2018-04-29: qty 1

## 2018-04-29 MED ORDER — PREDNISONE 10 MG (48) PO TBPK: ORAL_TABLET | ORAL | 0 refills | Status: AC

## 2018-04-29 MED ORDER — BACLOFEN 10 MG PO TABS: 10.0000 mg | ORAL_TABLET | Freq: Three times a day (TID) | ORAL | 1 refills | Status: AC

## 2018-04-29 MED ORDER — TRAMADOL HCL 50 MG PO TABS: 50.0000 mg | ORAL_TABLET | Freq: Four times a day (QID) | ORAL | 0 refills | Status: AC | PRN

## 2018-04-29 NOTE — Discharge Instructions (Addendum)
Follow-up [facility].  Please call for an appointment.  Take the medications as prescribed.  Return to the emergency department if worsening.  If you begin to lose control of your bowel or bladder please return emergency department immediately.  Apply ice to the lower back.  Consider buying a tens unit to help alleviate some of the inflammation.  You could also see a chiropractor.

## 2018-04-29 NOTE — ED Triage Notes (Signed)
Pt to ed with c/o back pain that radiates down left leg.  Pt states he was seen [facility] at [facility], given meds, but pt states no relief.

## 2018-04-29 NOTE — ED Provider Notes (Signed)
[facility] Emergency Department Provider Note  ____________________________________________   First MD Initiated Contact with Patient 04/29/18 0840     (approximate)  I have reviewed the triage vital signs and the nursing notes.   HISTORY  Chief Complaint Back Pain    HPI Andre Yang is a 57 y.o. male  C/o low back pain for several weeks, positive known injury, states he was at work and lifted something.  States that the pain is in the lower back and radiates down the left leg.  Pain is worse with movement, increased with bending over, denies numbness, tingling, or changes in bowel/urinary habits, [facility].  He states that he has been placed on meloxicam 7.5 mg daily and Flexeril 5 mg daily.  He states these medications are not helping.  He states he is frustrated as he has not had any imaging.  He has been to the urgent care several times with a workers comp.  Remainder ros neg   History reviewed. No pertinent past medical history.  There are no active problems to display for this patient.   Past Surgical History:  Procedure Laterality Date  . EYE SURGERY Bilateral 2018    Prior to Admission medications   Medication Sig Start Date End Date Taking? Authorizing Provider  baclofen (LIORESAL) 10 MG tablet Take 1 tablet (10 mg total) by mouth 3 (three) times daily. 04/29/18 04/29/19  Farid Grigorian, Roselyn Bering, PA-C  lidocaine (LIDODERM) 5 % Place 1 patch onto the skin daily. Remove & Discard patch within 12 hours or as directed by MD 04/29/18   Sherrie Mustache, Roselyn Bering, PA-C  predniSONE (STERAPRED UNI-PAK 48 TAB) 10 MG (48) TBPK tablet Take 6 pills for 2 days, 5 pills x 2d, 4 pills x 2d, 3 pills x 2d, 2 pills x 2d, 1 pill x 2d 04/29/18   Sherrie Mustache Roselyn Bering, PA-C  traMADol (ULTRAM) 50 MG tablet Take 1 tablet (50 mg total) by mouth every 6 (six) hours as needed. 04/29/18   Faythe Ghee, PA-C    Allergies Patient has no known allergies.  Family  History  Problem Relation Age of Onset  . Healthy Mother   . Lung disease Father     Social History Social History   Tobacco Use  . Smoking status: Never Smoker  . Smokeless tobacco: Never Used  Substance Use Topics  . Alcohol use: Yes    Comment: occasional  . Drug use: Never    Review of Systems  Constitutional: No fever/chills Eyes: No visual changes. ENT: No sore throat. Respiratory: Denies cough Genitourinary: Negative for dysuria. Musculoskeletal: Positive for back pain. Skin: Negative for rash.    ____________________________________________   PHYSICAL EXAM:  VITAL SIGNS: ED Triage Vitals  Enc Vitals Group     BP 04/29/18 0838 (!) 146/89     Pulse Rate 04/29/18 0838 (!) 111     Resp 04/29/18 0838 20     Temp 04/29/18 0838 (!) 97.5 F (36.4 C)     Temp Source 04/29/18 0838 Oral     SpO2 04/29/18 0838 97 %     Weight 04/29/18 0840 264 lb 8.8 oz (120 kg)     Height 04/29/18 0840 5\' 9"  (1.753 m)     Head Circumference --      Peak Flow --      Pain Score 04/29/18 0839 9     Pain Loc --      Pain Edu? --  Excl. in GC? --     Constitutional: Alert and oriented. Well appearing and in no acute distress. Eyes: Conjunctivae are normal.  Head: Atraumatic. Nose: No congestion/rhinnorhea. Mouth/Throat: Mucous membranes are moist.   Neck:  supple no lymphadenopathy noted Cardiovascular: Normal rate, regular rhythm. Heart sounds are normal Respiratory: Normal respiratory effort.  No retractions, lungs c t a  Abd: soft nontender bs normal all 4 quad, no pulsatile masses noted GU: deferred Musculoskeletal: FROM all extremities, warm and well perfused.  Decreased rom of back due to discomfort, lumbar spine nontender, negative Slr, full strength in great toes b/l, full strength in lower legs, n/v intact, pain is reproduced with palpation of the left SI joint Neurologic:  Normal speech and language.  Skin:  Skin is warm, dry and intact. No rash  noted. Psychiatric: Mood and affect are normal. Speech and behavior are normal.  ____________________________________________   LABS (all labs ordered are listed, but only abnormal results are displayed)  Labs Reviewed - No data to display ____________________________________________   ____________________________________________  RADIOLOGY  X-ray of the lumbar spine shows anterolisthesis of L4-L5  ____________________________________________   PROCEDURES  Procedure(s) performed: Percocet 5/325 1 p.o.  Procedures    ____________________________________________   INITIAL IMPRESSION / ASSESSMENT AND PLAN / ED COURSE  Pertinent labs & imaging results that were available during my care of the patient were reviewed by me and considered in my medical decision making (see chart for details).   Patient is a 57 year old male presents emergency department complaining of low back pain.  He has been seen here, urgent care, and is not improving.  He is requesting imaging.  He states that he has been on several medications which have not helped.  He states the medicine he received from the ED helped more [facility] from [facility].  On physical exam patient appears well although his heart rate is elevated.  Patient is uncomfortable.  Lumbar spine is minimally tender.  There is a large spasm noted along the left left SI joint.  X-ray of the lumbar spine shows anterolisthesis of L4-L5.  Explained findings to the patient.  He had been given Percocet 5/325 1 p.o. states this is helped with his pain somewhat.  We discussed the options of his care.  He was given a prescription for Sterapred 10 mg 12-day Dosepak, baclofen, and tramadol.  He is to follow-up with orthopedics.  [facility] was [facility].  He states he understands and will comply.  He is to apply ice to the lower back.  Lidoderm patches to the lower back.  And consider a TENS unit.  He was also instructed to beware of  drowsiness due to muscle relaxers and pain medications.  States he understands will comply.  He was discharged in stable condition.     As part of my medical decision making, I reviewed the following data within the electronic MEDICAL RECORD NUMBER Nursing notes reviewed and incorporated, Old chart reviewed, Radiograph reviewed x-ray lumbar spine shows anterolisthesis, Notes from prior ED visits and [place] Controlled Substance Database  ____________________________________________   FINAL CLINICAL IMPRESSION(S) / ED DIAGNOSES  Final diagnoses:  Lumbar radicular pain  Anterolisthesis  Sciatica of left side      NEW MEDICATIONS STARTED DURING THIS VISIT:  Discharge Medication List as of 04/29/2018  9:41 AM    START taking these medications   Details  baclofen (LIORESAL) 10 MG tablet Take 1 tablet (10 mg total) by mouth 3 (three) times  daily., Starting Fri 04/29/2018, Until Sat 04/29/2019, Normal    lidocaine (LIDODERM) 5 % Place 1 patch onto the skin daily. Remove & Discard patch within 12 hours or as directed by MD, Starting Fri 04/29/2018, Normal    predniSONE (STERAPRED UNI-PAK 48 TAB) 10 MG (48) TBPK tablet Take 6 pills for 2 days, 5 pills x 2d, 4 pills x 2d, 3 pills x 2d, 2 pills x 2d, 1 pill x 2d, Normal         Note:  This document was prepared using Dragon voice recognition software and may include unintentional dictation errors.     Faythe GheeFisher, Shanara Schnieders W, PA-C 04/29/18 1004    Jene EveryKinner, Robert, MD 04/29/18 1012

## 2018-05-05 ENCOUNTER — Other Ambulatory Visit: Payer: Self-pay

## 2018-05-05 ENCOUNTER — Emergency Department
Admission: EM | Admit: 2018-05-05 | Discharge: 2018-05-05 | Disposition: A | Discharge status: Home / Self Care | Attending: Emergency Medicine | Admitting: Emergency Medicine

## 2018-05-05 ENCOUNTER — Encounter: Payer: Self-pay | Admitting: Emergency Medicine

## 2018-05-05 DIAGNOSIS — M545 Low back pain: Secondary | ICD-10-CM | POA: Diagnosis present

## 2018-05-05 DIAGNOSIS — M4306 Spondylolysis, lumbar region: Secondary | ICD-10-CM | POA: Diagnosis not present

## 2018-05-05 MED ORDER — OXYCODONE-ACETAMINOPHEN 7.5-325 MG PO TABS: 1.0000 | ORAL_TABLET | Freq: Four times a day (QID) | ORAL | 0 refills | Status: AC | PRN

## 2018-05-05 MED ORDER — CYCLOBENZAPRINE HCL 10 MG PO TABS: 10.0000 mg | ORAL_TABLET | Freq: Three times a day (TID) | ORAL | 0 refills | Status: AC | PRN

## 2018-05-05 MED ORDER — KETOROLAC TROMETHAMINE 60 MG/2ML IM SOLN
60.0000 mg | Freq: Once | INTRAMUSCULAR | Status: AC
  Administered 2018-05-05: 60 mg via INTRAMUSCULAR
  Filled 2018-05-05: qty 2

## 2018-05-05 MED ORDER — HYDROMORPHONE HCL 1 MG/ML IJ SOLN
1.0000 mg | Freq: Once | INTRAMUSCULAR | Status: AC
  Administered 2018-05-05: 1 mg via INTRAMUSCULAR
  Filled 2018-05-05: qty 1

## 2018-05-05 MED ORDER — ORPHENADRINE CITRATE 30 MG/ML IJ SOLN
60.0000 mg | Freq: Two times a day (BID) | INTRAMUSCULAR | Status: DC
  Administered 2018-05-05: 60 mg via INTRAMUSCULAR
  Filled 2018-05-05: qty 2

## 2018-05-05 NOTE — ED Triage Notes (Signed)
Left lower back pain that rad down left leg.  Seen here on 12/20 and had initial releif, but pain came back next day.

## 2018-05-05 NOTE — ED Notes (Addendum)
First Nurse Note: Patient complaining of left sided back pain.  Refuses WC.  Accompanied by Mother.

## 2018-05-05 NOTE — ED Provider Notes (Signed)
[facility] Emergency Department Provider Note   ____________________________________________   First MD Initiated Contact with Patient 05/05/18 [phone]     (approximate)  I have reviewed the triage vital signs and the nursing notes.   HISTORY  Chief Complaint Back Pain    HPI Baker Andre Yang is a 57 y.o. male patient presents with continued radicular low back pain to the left lower extremity.  Patient denies bladder bowel dysfunction.  Patient was seen in this facility 6 days ago and received initial relief but states the pain has returned.  Patient has not follow-up for contact orthopedic department as directed.  Patient evidently did not reviewed his discharge care instructions to follow orthopedic.  He state he has been waiting for referral.  Advised patient he must contact orthopedic department to schedule appointment.  Patient rates his pain as a 10/10.  Patient described pain is "sharp".  Patient state pain increases with sitting or laying down.  No palliative measures today for this complaint.  History reviewed. No pertinent past medical history.  There are no active problems to display for this patient.   Past Surgical History:  Procedure Laterality Date  . EYE SURGERY Bilateral 2018    Prior to Admission medications   Medication Sig Start Date End Date Taking? Authorizing Provider  baclofen (LIORESAL) 10 MG tablet Take 1 tablet (10 mg total) by mouth 3 (three) times daily. 04/29/18 04/29/19  Fisher, Roselyn BeringSusan W, PA-C  cyclobenzaprine (FLEXERIL) 10 MG tablet Take 1 tablet (10 mg total) by mouth 3 (three) times daily as needed. 05/05/18   Joni ReiningSmith,  K, PA-C  lidocaine (LIDODERM) 5 % Place 1 patch onto the skin daily. Remove & Discard patch within 12 hours or as directed by MD 04/29/18   Sherrie MustacheFisher, Roselyn BeringSusan W, PA-C  oxyCODONE-acetaminophen (PERCOCET) 7.5-325 MG tablet Take 1 tablet by mouth every 6 (six) hours as needed. 05/05/18   Joni ReiningSmith,  K, PA-C    predniSONE (STERAPRED UNI-PAK 48 TAB) 10 MG (48) TBPK tablet Take 6 pills for 2 days, 5 pills x 2d, 4 pills x 2d, 3 pills x 2d, 2 pills x 2d, 1 pill x 2d 04/29/18   Sherrie MustacheFisher, Roselyn BeringSusan W, PA-C  traMADol (ULTRAM) 50 MG tablet Take 1 tablet (50 mg total) by mouth every 6 (six) hours as needed. 04/29/18   Faythe GheeFisher, Susan W, PA-C    Allergies Patient has no known allergies.  Family History  Problem Relation Age of Onset  . Healthy Mother   . Lung disease Father     Social History Social History   Tobacco Use  . Smoking status: Never Smoker  . Smokeless tobacco: Never Used  Substance Use Topics  . Alcohol use: Yes    Comment: occasional  . Drug use: Never    Review of Systems Constitutional: No fever/chills Eyes: No visual changes. ENT: No sore throat. Cardiovascular: Denies chest pain. Respiratory: Denies shortness of breath. Gastrointestinal: No abdominal pain.  No nausea, no vomiting.  No diarrhea.  No constipation. Genitourinary: Negative for dysuria. Musculoskeletal: Low back pain. Skin: Negative for rash. Neurological: Negative for headaches, focal weakness or numbness.   ____________________________________________   PHYSICAL EXAM:  VITAL SIGNS: ED Triage Vitals  Enc Vitals Group     BP 05/05/18 0837 116/78     Pulse Rate 05/05/18 0837 (!) 118     Resp 05/05/18 0837 20     Temp 05/05/18 0837 97.7 F (36.5 C)     Temp Source 05/05/18 0837 Oral  SpO2 05/05/18 0837 95 %     Weight 05/05/18 0838 264 lb 8.8 oz (120 kg)     Height 05/05/18 0838 5\' 9"  (1.753 m)     Head Circumference --      Peak Flow --      Pain Score 05/05/18 0838 8     Pain Loc --      Pain Edu? --      Excl. in GC? --     Constitutional: Alert and oriented.  Moderate distress.   Cardiovascular: Normal rate, regular rhythm. Grossly normal heart sounds.  Good peripheral circulation. Respiratory: Normal respiratory effort.  No retractions. Lungs CTAB. Musculoskeletal: No obvious spinal  deformity.  Patient decreased range of motion all feels.  Patient has moderate guarding palpation of L4-S1.Marland Kitchen. Neurologic:  Normal speech and language. No gross focal neurologic deficits are appreciated. No gait instability. Skin:  Skin is warm, dry and intact. No rash noted. Psychiatric: Mood and affect are normal. Speech and behavior are normal.  ____________________________________________   LABS (all labs ordered are listed, but only abnormal results are displayed)  Labs Reviewed - No data to display ____________________________________________  EKG   ____________________________________________  RADIOLOGY  ED MD interpretation:    Official radiology report(s): No results found.  ____________________________________________   PROCEDURES  Procedure(s) performed: None  Procedures  Critical Care performed: No  ____________________________________________   INITIAL IMPRESSION / ASSESSMENT AND PLAN / ED COURSE  As part of my medical decision making, I reviewed the following data within the electronic MEDICAL RECORD NUMBER    Patient presents for follow-up of low back pain.  Patient has pars defects and was advised to follow-up with orthopedics.  Patient that he did not notice down his previous discharge paperwork.  Patient pain improved with IM injection of Toradol, Norflex, and Toradol.  Patient advised to follow orthopedic for definitive evaluation and treatment.      ____________________________________________   FINAL CLINICAL IMPRESSION(S) / ED DIAGNOSES  Final diagnoses:  Pars defect of lumbar spine     ED Discharge Orders         Ordered    oxyCODONE-acetaminophen (PERCOCET) 7.5-325 MG tablet  Every 6 hours PRN     05/05/18 0943    cyclobenzaprine (FLEXERIL) 10 MG tablet  3 times daily PRN     05/05/18 0943           Note:  This document was prepared using Dragon voice recognition software and may include unintentional dictation errors.      Joni ReiningSmith,  K, PA-C 05/05/18 [id number]    Emily FilbertWilliams, Jonathan E, MD 05/05/18 [phone]

## 2018-05-05 NOTE — ED Notes (Signed)
See triage note.  Presents with lower back pain which is moving into left leg  States pain started on 12/3  Has been seen several times for same  States he has been using muscle relaxers and ice/heat with min to no relief    Ambulates with slight limp d/t pain

## 2019-01-18 ENCOUNTER — Emergency Department
Admission: EM | Admit: 2019-01-18 | Discharge: 2019-01-18 | Disposition: A | Discharge status: Home / Self Care | Payer: BC Managed Care – PPO | Attending: Emergency Medicine | Admitting: Emergency Medicine

## 2019-01-18 ENCOUNTER — Encounter: Payer: Self-pay | Admitting: Emergency Medicine

## 2019-01-18 ENCOUNTER — Other Ambulatory Visit: Payer: Self-pay

## 2019-01-18 DIAGNOSIS — H5789 Other specified disorders of eye and adnexa: Secondary | ICD-10-CM | POA: Diagnosis present

## 2019-01-18 DIAGNOSIS — H109 Unspecified conjunctivitis: Secondary | ICD-10-CM | POA: Insufficient documentation

## 2019-01-18 DIAGNOSIS — B9689 Other specified bacterial agents as the cause of diseases classified elsewhere: Secondary | ICD-10-CM

## 2019-01-18 MED ORDER — OLOPATADINE HCL 0.1 % OP SOLN: 1.0000 [drp] | Freq: Two times a day (BID) | OPHTHALMIC | 1 refills | Status: AC

## 2019-01-18 MED ORDER — SULFACETAMIDE SODIUM 10 % OP SOLN: 2.0000 [drp] | Freq: Four times a day (QID) | OPHTHALMIC | 0 refills | Status: AC

## 2019-01-18 NOTE — ED Notes (Signed)
Reports pain 5/10 aching at night. Blurred vision in right eye.

## 2019-01-18 NOTE — ED Provider Notes (Signed)
[facility] Emergency Department Provider Note   ____________________________________________   First MD Initiated Contact with Patient 01/18/19 [phone]     (approximate)  I have reviewed the triage vital signs and the nursing notes.   HISTORY  Chief Complaint Eye Problem    HPI Baker JanusJerry Lundquist is a 58 y.o. male patient complaining of right red eye for 2 days.  Patient eyelid is swollen.  Patient said left eye became infected this morning.  Patient did purulent drainage when he woke up this morning.  Patient denies vision disturbance.  Patient denies pain.  No palliative measure for complaint.  Patient does not wear contact lenses.         History reviewed. No pertinent past medical history.  There are no active problems to display for this patient.   Past Surgical History:  Procedure Laterality Date  . EYE SURGERY Bilateral 2018    Prior to Admission medications   Medication Sig Start Date End Date Taking? Authorizing Provider  baclofen (LIORESAL) 10 MG tablet Take 1 tablet (10 mg total) by mouth 3 (three) times daily. 04/29/18 04/29/19  Fisher, Roselyn BeringSusan W, PA-C  cyclobenzaprine (FLEXERIL) 10 MG tablet Take 1 tablet (10 mg total) by mouth 3 (three) times daily as needed. 05/05/18   Joni ReiningSmith, Kyisha Fowle K, PA-C  lidocaine (LIDODERM) 5 % Place 1 patch onto the skin daily. Remove & Discard patch within 12 hours or as directed by MD 04/29/18   Sherrie MustacheFisher, Roselyn BeringSusan W, PA-C  olopatadine (PATANOL) 0.1 % ophthalmic solution Place 1 drop into both eyes 2 (two) times daily. 01/18/19 01/18/20  Joni ReiningSmith, Lizbeth Feijoo K, PA-C  oxyCODONE-acetaminophen (PERCOCET) 7.5-325 MG tablet Take 1 tablet by mouth every 6 (six) hours as needed. 05/05/18   Joni ReiningSmith, Warda Mcqueary K, PA-C  predniSONE (STERAPRED UNI-PAK 48 TAB) 10 MG (48) TBPK tablet Take 6 pills for 2 days, 5 pills x 2d, 4 pills x 2d, 3 pills x 2d, 2 pills x 2d, 1 pill x 2d 04/29/18   Sherrie MustacheFisher, Roselyn BeringSusan W, PA-C  sulfacetamide (BLEPH-10) 10 % ophthalmic  solution Place 2 drops into both eyes 4 (four) times daily for 10 days. 01/18/19 01/28/19  Joni ReiningSmith, Carola Viramontes K, PA-C  traMADol (ULTRAM) 50 MG tablet Take 1 tablet (50 mg total) by mouth every 6 (six) hours as needed. 04/29/18   Faythe GheeFisher, Susan W, PA-C    Allergies Patient has no known allergies.  Family History  Problem Relation Age of Onset  . Healthy Mother   . Lung disease Father     Social History Social History   Tobacco Use  . Smoking status: Never Smoker  . Smokeless tobacco: Never Used  Substance Use Topics  . Alcohol use: Yes    Comment: occasional  . Drug use: Never    Review of Systems Constitutional: No fever/chills Eyes: No visual changes.  Drainage from her eyes.  Swollen right eyelid lid ENT: No sore throat. Cardiovascular: Denies chest pain. Respiratory: Denies shortness of breath. Gastrointestinal: No abdominal pain.  No nausea, no vomiting.  No diarrhea.  No constipation. Genitourinary: Negative for dysuria. Musculoskeletal: Negative for back pain. Skin: Negative for rash. Neurological: Negative for headaches, focal weakness or numbness.   ____________________________________________   PHYSICAL EXAM:  VITAL SIGNS: ED Triage Vitals  Enc Vitals Group     BP 01/18/19 0858 (!) 147/109     Pulse Rate 01/18/19 0858 92     Resp 01/18/19 0858 16     Temp 01/18/19 0858 98.5 F (36.9 C)  Temp Source 01/18/19 0858 Oral     SpO2 01/18/19 0858 95 %     Weight --      Height --      Head Circumference --      Peak Flow --      Pain Score 01/18/19 0859 0     Pain Loc --      Pain Edu? --      Excl. in [place]? --     Constitutional: Alert and oriented. Well appearing and in no acute distress. Eyes: Conjunctivae are normal. PERRL. EOMI. edematous right upper eyelid.  Purulent drainage bilateral eye. Cardiovascular: Normal rate, regular rhythm. Grossly normal heart sounds.  Good peripheral circulation. Respiratory: Normal respiratory effort.  No retractions.  Lungs CTAB. Neurologic:  Normal speech and language. No gross focal neurologic deficits are appreciated. No gait instability. Skin:  Skin is warm, dry and intact. No rash noted. Psychiatric: Mood and affect are normal. Speech and behavior are normal.  ____________________________________________   LABS (all labs ordered are listed, but only abnormal results are displayed)  Labs Reviewed - No data to display ____________________________________________  EKG   ____________________________________________  RADIOLOGY  ED MD interpretation:    Official radiology report(s): No results found.  ____________________________________________   PROCEDURES  Procedure(s) performed (including Critical Care):  Procedures   ____________________________________________   INITIAL IMPRESSION / ASSESSMENT AND PLAN / ED COURSE  As part of my medical decision making, I reviewed the following data within the electronic MEDICAL RECORD NUMBER         Garek Schuneman was evaluated in Emergency Department on 01/18/2019 for the symptoms described in the history of present illness. He was evaluated in the context of the global COVID-19 pandemic, which necessitated consideration that the patient might be at risk for infection with the SARS-CoV-2 virus that causes COVID-19. Institutional protocols and algorithms that pertain to the evaluation of patients at risk for COVID-19 are in a state of rapid change based on information released by regulatory bodies including the CDC and federal and state organizations. These policies and algorithms were followed during the patient's care in the ED.  Patient presents with tenderness right eyelid and bilateral purulent drainage.  Physical exam consistent bacterial conjunctivitis.  Patient given discharge care instruction advised use eyedrops as directed.  Patient advised follow-up ophthalmology if no improvement in 3 to 5 days.       ____________________________________________   FINAL CLINICAL IMPRESSION(S) / ED DIAGNOSES  Final diagnoses:  Bacterial conjunctivitis of both eyes     ED Discharge Orders         Ordered    sulfacetamide (BLEPH-10) 10 % ophthalmic solution  4 times daily     01/18/19 0922    olopatadine (PATANOL) 0.1 % ophthalmic solution  2 times daily     01/18/19 0630           Note:  This document was prepared using Dragon voice recognition software and may include unintentional dictation errors.    Sable Feil, PA-C 01/18/19 1601    Earleen Newport, MD 01/18/19 [phone]

## 2019-01-18 NOTE — ED Triage Notes (Signed)
Right eye red for 2 days.  Now lid is swollen.  His left eye is red as well.  Says pus when he get up in am

## 2020-04-14 IMAGING — CR DG LUMBAR SPINE 2-3V
1 series · 3 of 3 positions shown · non-contrast
Comparison: 02/25/2015

CLINICAL DATA: 57-year-old male with a history of pain after
lifting injury at work

EXAM:
LUMBAR SPINE - 2-3 VIEW

[Series 1: dg lumbar spine 2-3 views · 0.14mm/px · 3 of 3 slices shown]
[im 1/3]
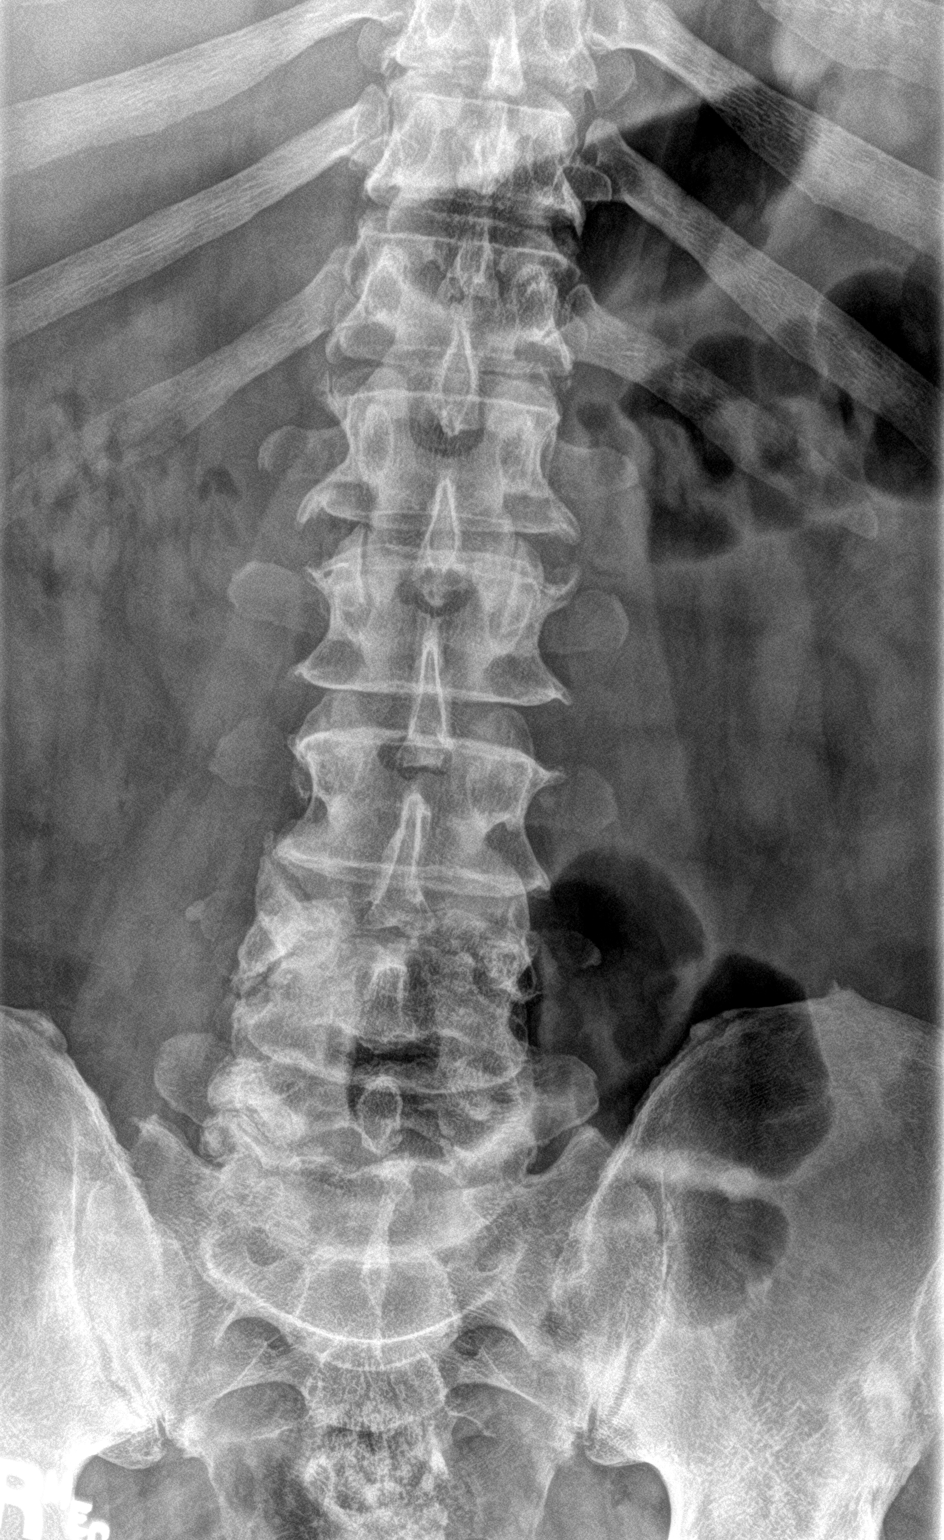
[im 2/3]
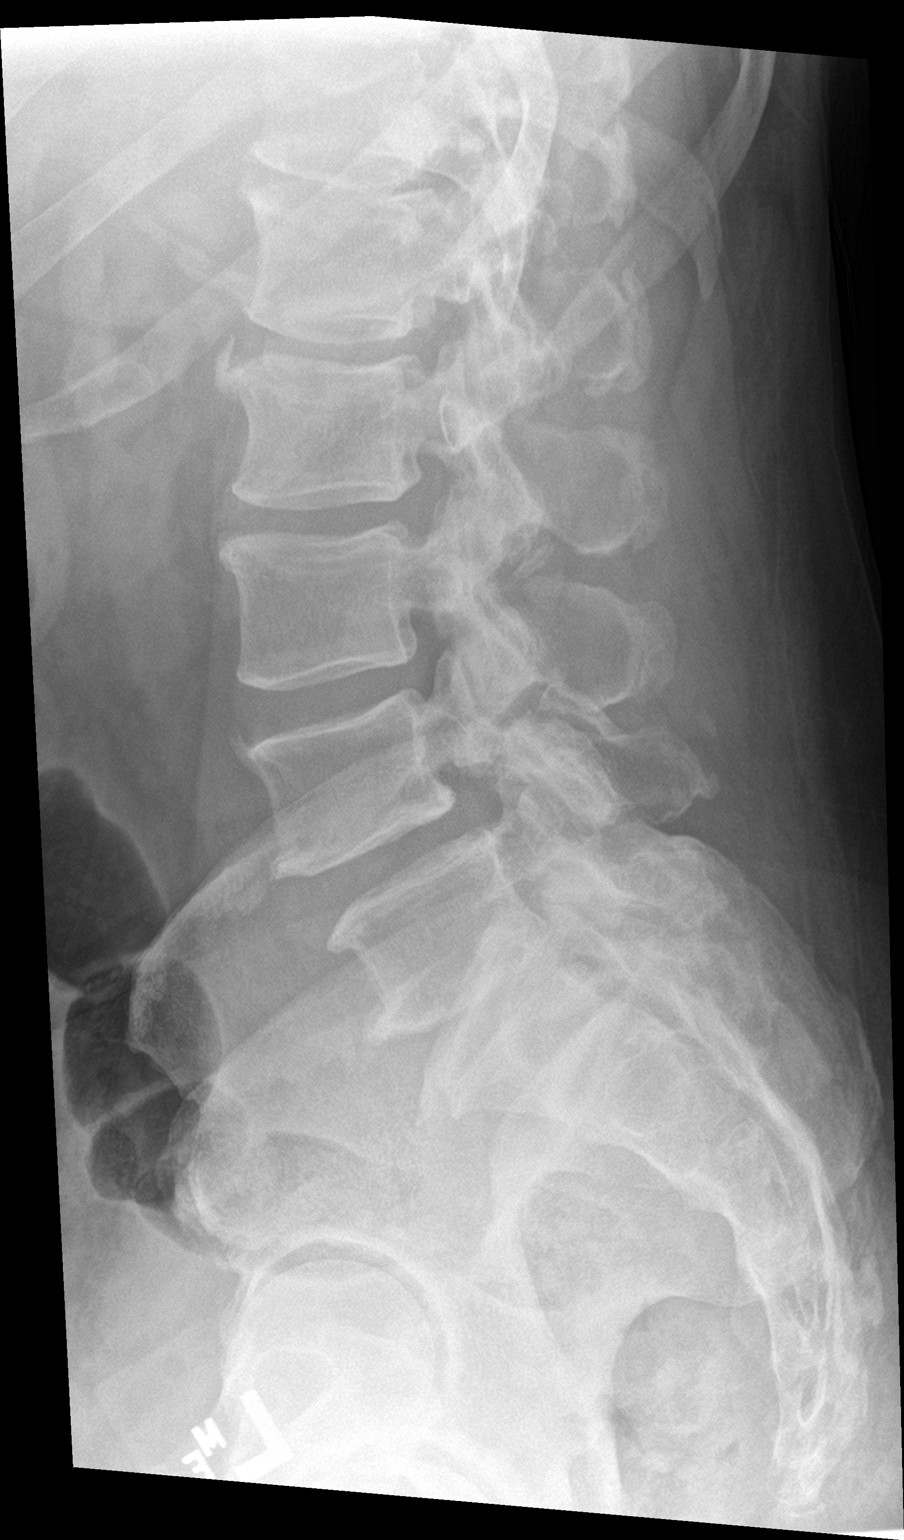
[im 3/3]
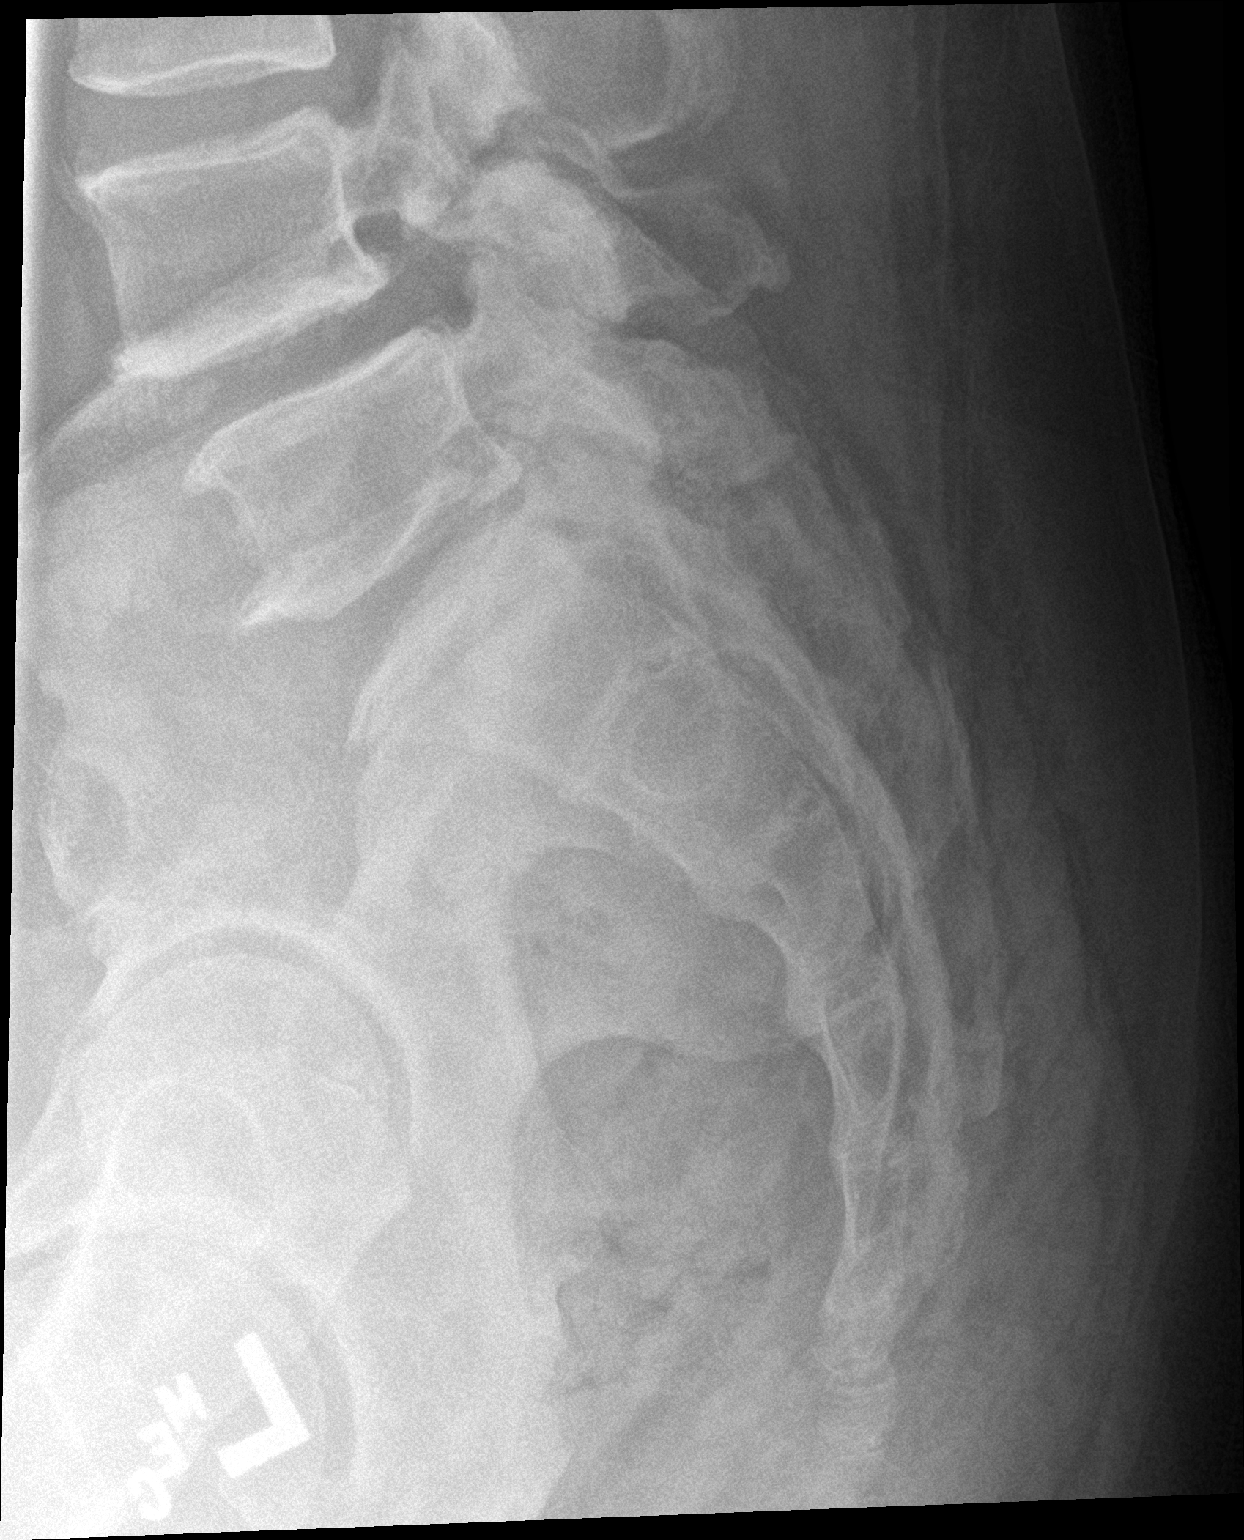

[3 of 3 positions shown; findings below may reference images not displayed]

FINDINGS: Lumbar Spine:

Lumbar vertebral elements maintain normal alignment without evidence
of subluxation.

There is grade 1 anterolisthesis of L4 on L5 with associated pars
defect, new from the comparison plain film.

No acute fracture line identified.

Vertebral body heights maintained.

Early degenerative disc disease with minimal disc space narrowing
and endplate changes throughout the lumbar spine most pronounced at
L5-S1.

Facet hypertrophy most pronounced L3-S1.

Unremarkable appearance of the visualized abdomen.
IMPRESSION: No acute fracture.

New L4 pars defect and associated grade 1 anterolisthesis of L4 on
L5
